# Patient Record
Sex: Female | Born: 1965 | Race: White | Hispanic: No | Marital: Married | Smoking: Never smoker
Health system: Southern US, Community
[De-identification: ages and names within clinical notes are randomized; demographics above are authoritative.]

## PROBLEM LIST (undated history)

## (undated) DIAGNOSIS — K760 Fatty (change of) liver, not elsewhere classified: Secondary | ICD-10-CM

## (undated) DIAGNOSIS — K219 Gastro-esophageal reflux disease without esophagitis: Secondary | ICD-10-CM

## (undated) DIAGNOSIS — Z8619 Personal history of other infectious and parasitic diseases: Secondary | ICD-10-CM

## (undated) DIAGNOSIS — K828 Other specified diseases of gallbladder: Secondary | ICD-10-CM

## (undated) DIAGNOSIS — N809 Endometriosis, unspecified: Secondary | ICD-10-CM

## (undated) DIAGNOSIS — J4520 Mild intermittent asthma, uncomplicated: Secondary | ICD-10-CM

## (undated) HISTORY — DX: Personal history of other infectious and parasitic diseases: Z86.19

## (undated) HISTORY — DX: Endometriosis, unspecified: N80.9

## (undated) HISTORY — DX: Other specified diseases of gallbladder: K82.8

## (undated) HISTORY — DX: Gastro-esophageal reflux disease without esophagitis: K21.9

## (undated) HISTORY — DX: Mild intermittent asthma, uncomplicated: J45.20

## (undated) HISTORY — DX: Fatty (change of) liver, not elsewhere classified: K76.0

---

## 2004-08-29 HISTORY — PX: ANKLE SURGERY: SHX546

## 2011-07-04 ENCOUNTER — Ambulatory Visit: Payer: Self-pay

## 2011-11-24 ENCOUNTER — Encounter: Payer: Self-pay | Admitting: Gynecology

## 2011-11-24 ENCOUNTER — Ambulatory Visit (INDEPENDENT_AMBULATORY_CARE_PROVIDER_SITE_OTHER): Payer: PRIVATE HEALTH INSURANCE | Admitting: Gynecology

## 2011-11-24 VITALS — Ht 64.5 in | Wt 160.0 lb

## 2011-11-24 DIAGNOSIS — R102 Pelvic and perineal pain: Secondary | ICD-10-CM

## 2011-11-24 DIAGNOSIS — N926 Irregular menstruation, unspecified: Secondary | ICD-10-CM

## 2011-11-24 DIAGNOSIS — K5900 Constipation, unspecified: Secondary | ICD-10-CM

## 2011-11-24 DIAGNOSIS — N949 Unspecified condition associated with female genital organs and menstrual cycle: Secondary | ICD-10-CM

## 2011-11-24 LAB — PROLACTIN: Prolactin: 6.4 ng/mL

## 2011-11-24 LAB — HCG, SERUM, QUALITATIVE: Preg, Serum: NEGATIVE

## 2011-11-24 NOTE — Patient Instructions (Signed)
Follow-up for blood work results and ultrasound as scheduled 

## 2011-11-24 NOTE — Progress Notes (Signed)
46 year old G0 new patient presents with a history of regular menses, on oral contraceptives for years until approximately a year ago when she started having pelvic pain and irregular bleeding. She had an evaluation in [place] to include ultrasound were apparently a generous ovarian cyst 6 cm historically was visualized with ground glass appearance and she was diagnosed with stage IV endometriosis based on the ultrasound. She had her oral contraceptives changed and was followed expectantly noting some improvement in her pain but her bleeding on and off persistent where she now has 2 bleeds per month. She now has had a recurrence of her pain with constipation. She was told before when she had this episode of pain and constipation that it was due to the ovarian cyst pushing on her colon.  She has no history of surgeries before and is not being followed for any significant medical issues. She reports having her last normal GYN checkup with Pap smear in August 2012.  Exam of Sherrilyn Rist chaperone present Abdomen soft with mild lower abdominal discomfort. No rebound masses guarding or organomegaly. Pelvic external BUS vagina normal. Cervix normal. Uterus upper limits of normal size firm midline mobile mild tenderness. Right and left adnexa with mild tenderness but without overt masses. Rectovaginal exam is normal.  Assessment and plan: Pelvic pain/irregular bleeding history of ultrasound showing ovarian cyst with changes suggestive of an endometrioma without history of surgery. On low-dose oral contraceptives.  I reviewed the risks of oral contraceptives to include stroke heart attack DVT. She does not smoke and is not being followed for any medical issues. Start with baseline hormone studies due to the irregular bleeding to include TSH FSH prolactin hCG. We'll start with sonohysterogram to better define pelvis and endometrial cavity due to her irregular bleeding. Various scenarios and the options were reviewed.  Childbearing is not an issue with them and they have been thinking about hysterectomy. Conservative laparoscopy with cystectomy or oophorectomy was reviewed. The issues of stage IV endometriosis and possible advantage of robotic laparoscopy also discussed. The Lupron or other hormonal suppression options reviewed. Patient will follow with office visit after sonohysterogram and hormone studies.

## 2011-12-05 ENCOUNTER — Other Ambulatory Visit: Payer: Self-pay | Admitting: Gynecology

## 2011-12-05 DIAGNOSIS — R102 Pelvic and perineal pain: Secondary | ICD-10-CM

## 2011-12-05 DIAGNOSIS — N926 Irregular menstruation, unspecified: Secondary | ICD-10-CM

## 2011-12-08 ENCOUNTER — Encounter: Payer: Self-pay | Admitting: Gynecology

## 2011-12-08 ENCOUNTER — Ambulatory Visit (INDEPENDENT_AMBULATORY_CARE_PROVIDER_SITE_OTHER): Payer: PRIVATE HEALTH INSURANCE | Admitting: Gynecology

## 2011-12-08 ENCOUNTER — Ambulatory Visit (INDEPENDENT_AMBULATORY_CARE_PROVIDER_SITE_OTHER): Payer: PRIVATE HEALTH INSURANCE

## 2011-12-08 DIAGNOSIS — D251 Intramural leiomyoma of uterus: Secondary | ICD-10-CM

## 2011-12-08 DIAGNOSIS — N920 Excessive and frequent menstruation with regular cycle: Secondary | ICD-10-CM

## 2011-12-08 DIAGNOSIS — R102 Pelvic and perineal pain: Secondary | ICD-10-CM

## 2011-12-08 DIAGNOSIS — D259 Leiomyoma of uterus, unspecified: Secondary | ICD-10-CM

## 2011-12-08 DIAGNOSIS — N926 Irregular menstruation, unspecified: Secondary | ICD-10-CM

## 2011-12-08 DIAGNOSIS — N8300 Follicular cyst of ovary, unspecified side: Secondary | ICD-10-CM

## 2011-12-08 DIAGNOSIS — N83209 Unspecified ovarian cyst, unspecified side: Secondary | ICD-10-CM

## 2011-12-08 DIAGNOSIS — N831 Corpus luteum cyst of ovary, unspecified side: Secondary | ICD-10-CM

## 2011-12-08 DIAGNOSIS — N949 Unspecified condition associated with female genital organs and menstrual cycle: Secondary | ICD-10-CM

## 2011-12-08 NOTE — Progress Notes (Signed)
Patient presents for sonohysterogram with history per 11/24/2011 note. Her hormone studies a TSH, prolactin, hCG, FSH were all normal.  Ultrasound shows a fundal myoma and 33 mm and a small 9 mm subserosal myoma. Right ovary shows a thin-walled echo-free 34 x 26 x 16 mm and a thicker walled collapsed cyst at 16 x 14. Left ovary shows a follicle at 26 x 29 mm. There is a moderate amount of fluid in the cul-de-sac with some septation. Endometrial echo 3.8 mm. Sonohysterogram was performed, sterile technique, easy catheter introduction, good distention with no abnormalities. Endometrial biopsy was taken. Patient tolerated well.  Assessment and plan: Pelvic pain irregular bleeding on oral contraceptives. Been told that she had stage IV endometriosis without laparoscopy. Prior groundglass 6 cm cyst no longer visible. A reviewed there is options with the patient and her husband. Assuming biopsy normal expected management with or without changes in her contraception. Depo-Lupron x3-6 months, conservative laparoscopy, laparoscopic hysterectomy with or without BSO. The pros/cons, risks/benefits of each choice reviewed. She understands that if she indeed does have stage IV endometriosis with socked in pelvis there is certainly a possibility to begin with laparoscopy aborted at that point and refer for robotic/TAH versus starting off with robotics in the first place.  The ovarian conservation issue with or without endometriosis was also reviewed. Her husband will think of their options and will further discuss pending the biopsy results.

## 2011-12-08 NOTE — Patient Instructions (Signed)
Follow up for biopsy results. Think of your options as we discussed.

## 2011-12-16 ENCOUNTER — Encounter: Payer: Self-pay | Admitting: Gynecology

## 2011-12-28 ENCOUNTER — Telehealth: Payer: Self-pay | Admitting: Gynecology

## 2011-12-28 ENCOUNTER — Ambulatory Visit (INDEPENDENT_AMBULATORY_CARE_PROVIDER_SITE_OTHER): Payer: PRIVATE HEALTH INSURANCE | Admitting: Gynecology

## 2011-12-28 ENCOUNTER — Encounter: Payer: Self-pay | Admitting: Gynecology

## 2011-12-28 DIAGNOSIS — R102 Pelvic and perineal pain: Secondary | ICD-10-CM

## 2011-12-28 DIAGNOSIS — K5900 Constipation, unspecified: Secondary | ICD-10-CM

## 2011-12-28 DIAGNOSIS — N926 Irregular menstruation, unspecified: Secondary | ICD-10-CM

## 2011-12-28 DIAGNOSIS — N949 Unspecified condition associated with female genital organs and menstrual cycle: Secondary | ICD-10-CM

## 2011-12-28 NOTE — Progress Notes (Signed)
Patient presents to discuss management options. She has a history of recurrent pelvic pain and irregular menses. Had been treated with oral contraceptives and was diagnosed with stage IV endometriosis based on an ultrasound showing a 6 cm ovarian cyst. Patient has every 6 months or so a bout of severe constipation and pelvic pain that lasts a week which was attributed to this cyst/endometriosis. Recent ultrasound here showed bilateral ovarian cysts clear measuring 34 mm and 29 mm. She also a small fundal myoma measuring 33 mm.  Sonohysterogram showed a normal cavity with a biopsy showing secratory endometrium.  I reviewed options with the patient and her husband. Options for continued oral contraceptive suppression, progesterone only, trial of Depo-Lupron for 6 months, diagnostic laparoscopy, ovarian cystectomy, laparoscopic hysterectomy with or without salpingo-oophorectomy. The pros/cons, risk/benefits of each choice reviewed. My preference would be diagnostic laparoscopy which would allow for definitive diagnosis of endometriosis or not as she does not have a visual confirmation for this diagnosis. I do not think this is related to her bouts of constipation and pain as it is not monthly but occurs about every 6 months or so. I would recommend seeing a gastroenterologist in reference to this for further evaluation. Patient and her husband want to think about options. They asked if we could look into insurance issues is for surgery and will pursue that and they will follow up with me with their decision.

## 2011-12-28 NOTE — Telephone Encounter (Signed)
Left message home and cell phone to call me as I have checked her insurance benefits and ready to discuss with her.

## 2011-12-28 NOTE — Patient Instructions (Signed)
Follow up with decision as far as surgery is concerned.

## 2012-08-06 ENCOUNTER — Other Ambulatory Visit: Payer: Self-pay | Admitting: Gynecology

## 2012-08-07 NOTE — Telephone Encounter (Signed)
Spoke to pt she is due for AEX. She knows she will schedule. She has to call back I gave a refill to give her time to schedule

## 2012-09-04 ENCOUNTER — Other Ambulatory Visit: Payer: Self-pay | Admitting: Gynecology

## 2012-11-23 ENCOUNTER — Other Ambulatory Visit: Payer: Self-pay | Admitting: Gynecology

## 2012-11-26 ENCOUNTER — Encounter: Payer: Self-pay | Admitting: Gynecology

## 2012-11-26 ENCOUNTER — Ambulatory Visit (INDEPENDENT_AMBULATORY_CARE_PROVIDER_SITE_OTHER): Payer: Self-pay | Admitting: Gynecology

## 2012-11-26 VITALS — BP 130/80 | Ht 65.0 in | Wt 170.0 lb

## 2012-11-26 DIAGNOSIS — Z01419 Encounter for gynecological examination (general) (routine) without abnormal findings: Secondary | ICD-10-CM

## 2012-11-26 DIAGNOSIS — N809 Endometriosis, unspecified: Secondary | ICD-10-CM

## 2012-11-26 MED ORDER — NORETHIN ACE-ETH ESTRAD-FE 1-20 MG-MCG PO TABS: ORAL_TABLET | ORAL | Status: DC

## 2012-11-26 NOTE — Progress Notes (Signed)
Patricia Owen 11/04/1965 [id number]        47 y.o.  G0P0 for annual exam.  Several issues noted below.  Past medical history,surgical history, medications, allergies, family history and social history were all reviewed and documented in the EPIC chart. ROS:  Was performed and pertinent positives and negatives are included in the history.  Exam: Kim assistant Filed Vitals:   11/26/12 0910  BP: 130/80  Height: 5\' 5"  (1.651 m)  Weight: 170 lb (77.111 kg)   General appearance  Normal Skin grossly normal Head/Neck normal with no cervical or supraclavicular adenopathy thyroid normal Lungs  clear Cardiac RR, without RMG Abdominal  soft, nontender, without masses, organomegaly or hernia Breasts  examined lying and sitting without masses, retractions, discharge or axillary adenopathy. Pelvic  Ext/BUS/vagina  normal   Cervix  normal   Uterus  anteverted, normal size, shape and contour, midline and mobile nontender   Adnexa  Without masses or tenderness    Anus and perineum  normal   Rectovaginal  normal sphincter tone without palpated masses or tenderness.    Assessment/Plan:  47 y.o. G0P0 female for annual exam, regular menses, oral contraceptives..   1. History endometriosis/pelvic pain. Since restarting oral contraceptives in May she has had regular menses. She's also had relief of her pelvic pain which now is pretty much gone. She does note she's modified her diet to a decreased gluten and notes some improvement with this. I reviewed with her on how much of her discomfort was actually GYN versus GI. Currently she's doing well and wants to continue on the pills. I rediscussed the risks to include stroke heart attack DVT. Possible increased risk with age. She not being followed for any medical issues and does not smoke and accepts the risks. I refilled her prescription x1 year. 2. Mammography 2 years ago. Recommended mammogram now and she agrees with scheduling. SBE monthly reviewed. 3. Pap  smear 2012. None done today. No history of abnormal Pap smears previously. Plan repeat next year at 3 year interval. 4. Health maintenance. No lab work done today at her request. Reports having normal lipid profile and glucose 1-1/2 half year ago at her other gynecologist office and prefers not to repeat now she is paying cash. Will check urinalysis.  I did ask her to keep a track of her blood pressure which is 130/80. Have it rechecked in a non-exam situation as long as it runs normal then we'll follow if it does seem to creep up at all then she knows to report that to me. Otherwise followup 1 year, sooner as needed.    Dara Lords MD, 11:16 AM 11/26/2012

## 2012-11-26 NOTE — Patient Instructions (Signed)
Call to Schedule your mammogram  Facilities in [place]: 1)  [facility], [address] Phone: [phone] 2)  [facility], [address] Phone: [phone] 3)  Dr. Bertrand at [facility]  [address] Phone: [phone]     Mammogram A mammogram is an X-ray test to find changes in a woman's breast. You should get a mammogram if:  You are 40 years of age or older  You have risk factors.   Your doctor recommends that you have one.  BEFORE THE TEST  Do not schedule the test the week before your period, especially if your breasts are sore during this time.  On the day of your mammogram:  Wash your breasts and armpits well. After washing, do not put on any deodorant or talcum powder on until after your test.   Eat and drink as you usually do.   Take your medicines as usual.   If you are diabetic and take insulin, make sure you:   Eat before coming for your test.   Take your insulin as usual.   If you cannot keep your appointment, call before the appointment to cancel. Schedule another appointment.  TEST  You will need to undress from the waist up. You will put on a hospital gown.   Your breast will be put on the mammogram machine, and it will press firmly on your breast with a piece of plastic called a compression paddle. This will make your breast flatter so that the machine can X-ray all parts of your breast.   Both breasts will be X-rayed. Each breast will be X-rayed from above and from the side. An X-ray might need to be taken again if the picture is not good enough.   The mammogram will last about 15 to 30 minutes.  AFTER THE TEST Finding out the results of your test Ask when your test results will be ready. Make sure you get your test results.  Document Released: 11/11/2008 Document Revised: 08/04/2011 Document Reviewed: 11/11/2008 [employer] Patient  Information 2012 [employer].   

## 2012-11-27 LAB — URINALYSIS W MICROSCOPIC + REFLEX CULTURE
Bacteria, UA: NONE SEEN
Casts: NONE SEEN
Crystals: NONE SEEN
Ketones, ur: NEGATIVE mg/dL
Nitrite: NEGATIVE
Specific Gravity, Urine: 1.016 (ref 1.005–1.030)
Squamous Epithelial / LPF: NONE SEEN
Urobilinogen, UA: 0.2 mg/dL (ref 0.0–1.0)
pH: 6 (ref 5.0–8.0)

## 2013-11-18 ENCOUNTER — Ambulatory Visit (INDEPENDENT_AMBULATORY_CARE_PROVIDER_SITE_OTHER): Payer: BC Managed Care – PPO | Admitting: Pulmonary Disease

## 2013-11-18 ENCOUNTER — Encounter: Payer: Self-pay | Admitting: Pulmonary Disease

## 2013-11-18 ENCOUNTER — Encounter (INDEPENDENT_AMBULATORY_CARE_PROVIDER_SITE_OTHER): Payer: Self-pay

## 2013-11-18 VITALS — BP 118/78 | HR 83 | Temp 98.0°F | Ht 65.0 in | Wt 183.8 lb

## 2013-11-18 DIAGNOSIS — R1110 Vomiting, unspecified: Secondary | ICD-10-CM

## 2013-11-18 DIAGNOSIS — R058 Other specified cough: Secondary | ICD-10-CM

## 2013-11-18 DIAGNOSIS — R05 Cough: Secondary | ICD-10-CM

## 2013-11-18 DIAGNOSIS — R053 Chronic cough: Secondary | ICD-10-CM | POA: Insufficient documentation

## 2013-11-18 DIAGNOSIS — R059 Cough, unspecified: Secondary | ICD-10-CM

## 2013-11-18 DIAGNOSIS — J342 Deviated nasal septum: Secondary | ICD-10-CM

## 2013-11-18 MED ORDER — FLUTICASONE PROPIONATE 50 MCG/ACT NA SUSP: 2.0000 | Freq: Every day | NASAL | Status: DC

## 2013-11-18 MED ORDER — ALBUTEROL SULFATE HFA 108 (90 BASE) MCG/ACT IN AERS: 2.0000 | INHALATION_SPRAY | Freq: Four times a day (QID) | RESPIRATORY_TRACT | Status: DC | PRN

## 2013-11-18 NOTE — Progress Notes (Signed)
Chief Complaint  Patient presents with  . Pulmonary Consult    Self referral. C/o recurrent cough and congestion x 1 year. C/o mid back pain and SOB with cough, chest tightness when taking deep breaths.      History of Present Illness: Patricia Owen is a 48 y.o. female for evaluation of cough.  She has noticed a cough for the past two years.  She had strep throat then, and then developed a persistent cough.  She has been treated several times with antibiotics for bronchitis, but these have not helped.    She feels chest pressure and tightness that radiates around to her back when she has a cough.  She gets episodes of wheezing associated with chest congestion.  She has sinus congestion with green/yellow sputum.  She was tried on nasal spray (she is not sure which one), but this did not help.  She was tried on prednisone in January 2014 >> she could only take this for 2 days >> she felt like her skin was crawling.  She does not recall ever being tried on inhalers for her breathing.  She also reports feeling like something is stuck in her throat, and she will try to force a cough.  She will get into coughing spells, and this causes her to vomit sometimes.  She gets seasonal allergies, and this is worse in Spring and Fall.  She thinks she has allergies to pollen.  She has a pet dog, and no difficulty around her dog.  She has never had allergy testing.  She does not recall having a chest xray or breathing tests.  She notices more trouble in hot/humid or cold/dry weather.  She also notices more cough when laying flat.  She denies skin rash.  She has problems with heartburn after she vomits, but not otherwise.    She works as a Geophysicist/field seismologist.  She is from [place].  She has never had pneumonia.  Her mother had latent TB, but Mrs. Cerritos does not recall ever being told she had tuberculosis.   Zorana Brockwell  has a past medical history of Endometriosis.  Krystiana Fornes  has past surgical history that includes  Broken Ankle repair.  Prior to Admission medications   Medication Sig Start Date End Date Taking? Authorizing Provider  cholecalciferol (VITAMIN D) 1000 UNITS tablet Take 2,000 Units by mouth daily.   Yes Historical Provider, MD  norethindrone-ethinyl estradiol (JUNEL FE 1/20) 1-20 MG-MCG tablet TAKE 1 TABLET BY MOUTH ONCE A DAY 11/26/12  Yes Anastasio Auerbach, MD    Allergies  Allergen Reactions  . Benadryl [Diphenhydramine Hcl]   . Codeine   . Prednisone     Her family history includes Diabetes in her father; Heart disease in her father; Hypertension in her father.  She  reports that she has never smoked. She has never used smokeless tobacco. She reports that she drinks alcohol. She reports that she does not use illicit drugs.  Review of Systems  Constitutional: Positive for fever and unexpected weight change.  HENT: Positive for congestion, ear pain, nosebleeds, postnasal drip, rhinorrhea, sinus pressure and sore throat. Negative for dental problem, sneezing and trouble swallowing.   Eyes: Negative for redness and itching.  Respiratory: Positive for cough and chest tightness. Negative for shortness of breath and wheezing.   Cardiovascular: Negative for palpitations and leg swelling.  Gastrointestinal: Positive for nausea and vomiting.  Genitourinary: Negative for dysuria.  Musculoskeletal: Positive for joint swelling.  Skin: Negative for rash.  Neurological: Negative for headaches.  Hematological: Does not bruise/bleed easily.  Psychiatric/Behavioral: Negative for dysphoric mood. The patient is not nervous/anxious.    Physical Exam:  General - No distress ENT - No sinus tenderness, no oral exudate, no LAN, no thyromegaly, TM clear, pupils equal/reactive, nasal septal deviation Cardiac - s1s2 regular, no murmur, pulses symmetric Chest - No wheeze/rales/dullness, good air entry, normal respiratory excursion Back - No focal tenderness Abd - Soft, non-tender, no organomegaly, +  bowel sounds Ext - No edema Neuro - Normal strength, cranial nerves intact Skin - No rashes Psych - Normal mood, and behavior    Assessment/Plan:  Chesley Mires, MD [facility] Pager:  [phone]

## 2013-11-18 NOTE — Patient Instructions (Signed)
Will schedule chest xray and breathing test (PFT) Nasal irrigation (saline nasal spray daily) Flonase two sprays each nostril daily Salt water gargles once or twice per day Sip water when you have urge to cough Use sugarless candy to keep mouth moist Proair two puffs up to four times per day as needed for cough, wheeze, or chest congestion Follow up in 4 weeks

## 2013-11-18 NOTE — Assessment & Plan Note (Addendum)
She seems to have a component of allergies with post-nasal drip causing upper airway cough syndrome .  She likely has developed asthma after her respiratory infection two years ago.  These have contributed to cough induced emesis.  Will schedule chest xray and breathing test (PFT).  Advised her to use nasal irrigation daily, flonase two sprays each nostril daily.  She is to do salt water gargles once or twice per day, and sip water when you have urge to cough.  She should use sugarless candy to keep mouth moist.  Will also have her use proair two puffs up to four times per day as needed for cough, wheeze, or chest congestion for now.  She may need CT sinus and ENT evaluation for her nasal septal deviation if her sinus symptoms persist.  She may also need additional allergy evaluation at some point.

## 2013-11-18 NOTE — Progress Notes (Deleted)
   Subjective:    Patient ID: Aranda Bihm, female    DOB: 04/02/1966, 48 y.o.   MRN: [id number]  HPI    Review of Systems  Constitutional: Positive for fever and unexpected weight change.  HENT: Positive for congestion, ear pain, nosebleeds, postnasal drip, rhinorrhea, sinus pressure and sore throat. Negative for dental problem, sneezing and trouble swallowing.   Eyes: Negative for redness and itching.  Respiratory: Positive for cough and chest tightness. Negative for shortness of breath and wheezing.   Cardiovascular: Negative for palpitations and leg swelling.  Gastrointestinal: Positive for nausea and vomiting.  Genitourinary: Negative for dysuria.  Musculoskeletal: Positive for joint swelling.  Skin: Negative for rash.  Neurological: Negative for headaches.  Hematological: Does not bruise/bleed easily.  Psychiatric/Behavioral: Negative for dysphoric mood. The patient is not nervous/anxious.        Objective:   Physical Exam        Assessment & Plan:

## 2013-11-22 ENCOUNTER — Other Ambulatory Visit: Payer: Self-pay | Admitting: Gynecology

## 2013-11-25 ENCOUNTER — Telehealth: Payer: Self-pay | Admitting: Emergency Medicine

## 2013-11-25 NOTE — Telephone Encounter (Signed)
Called pt and left message on mobile phone for pt to call back. Pt will need to come in just to get a cxr. Pt was in office last week when xray was down. ATC home phone but was unable to leave message.

## 2013-11-26 NOTE — Telephone Encounter (Signed)
Called and spoke to pt. Pt stated she would be able to come in tomorrow morning, 11/27/2013, to get the CXR. Pt is aware of where to go and denied further questions or concerns at this time.

## 2013-11-27 ENCOUNTER — Ambulatory Visit (INDEPENDENT_AMBULATORY_CARE_PROVIDER_SITE_OTHER)
Admission: RE | Admit: 2013-11-27 | Discharge: 2013-11-27 | Disposition: A | Discharge status: Home / Self Care | Payer: BC Managed Care – PPO | Source: Ambulatory Visit | Attending: Pulmonary Disease | Admitting: Pulmonary Disease

## 2013-11-27 DIAGNOSIS — R059 Cough, unspecified: Secondary | ICD-10-CM

## 2013-11-27 DIAGNOSIS — R053 Chronic cough: Secondary | ICD-10-CM

## 2013-11-27 DIAGNOSIS — R05 Cough: Secondary | ICD-10-CM

## 2013-11-28 ENCOUNTER — Telehealth: Payer: Self-pay | Admitting: Pulmonary Disease

## 2013-11-28 NOTE — Telephone Encounter (Signed)
Patient calling for results.

## 2013-11-28 NOTE — Telephone Encounter (Signed)
Pt aware of results 

## 2013-11-28 NOTE — Telephone Encounter (Signed)
Dg Chest 2 View  11/27/2013   CLINICAL DATA:  Chronic cough  EXAM: CHEST  2 VIEW  COMPARISON:  None.  FINDINGS: Lungs are clear. Heart size and pulmonary vascularity are normal. No adenopathy. No bone lesions.  IMPRESSION: No edema or consolidation.   Electronically Signed   By: Lowella Grip M.D.   On: 11/27/2013 09:09    Will have my nurse inform pt that CXR is normal.

## 2013-12-09 ENCOUNTER — Ambulatory Visit (INDEPENDENT_AMBULATORY_CARE_PROVIDER_SITE_OTHER): Payer: BC Managed Care – PPO | Admitting: Gynecology

## 2013-12-09 ENCOUNTER — Encounter: Payer: Self-pay | Admitting: Gynecology

## 2013-12-09 ENCOUNTER — Other Ambulatory Visit (HOSPITAL_COMMUNITY)
Admission: RE | Admit: 2013-12-09 | Discharge: 2013-12-09 | Disposition: A | Discharge status: Home / Self Care | Payer: BC Managed Care – PPO | Source: Ambulatory Visit | Attending: Gynecology | Admitting: Gynecology

## 2013-12-09 VITALS — BP 120/70 | Ht 64.5 in | Wt 182.2 lb

## 2013-12-09 DIAGNOSIS — Z01419 Encounter for gynecological examination (general) (routine) without abnormal findings: Secondary | ICD-10-CM | POA: Insufficient documentation

## 2013-12-09 DIAGNOSIS — N926 Irregular menstruation, unspecified: Secondary | ICD-10-CM

## 2013-12-09 DIAGNOSIS — Z1151 Encounter for screening for human papillomavirus (HPV): Secondary | ICD-10-CM | POA: Insufficient documentation

## 2013-12-09 LAB — CBC WITH DIFFERENTIAL/PLATELET
Basophils Absolute: 0.1 10*3/uL (ref 0.0–0.1)
Basophils Relative: 1 % (ref 0–1)
Eosinophils Absolute: 0.1 10*3/uL (ref 0.0–0.7)
Eosinophils Relative: 1 % (ref 0–5)
HCT: 38.7 % (ref 36.0–46.0)
Hemoglobin: 12.8 g/dL (ref 12.0–15.0)
LYMPHS PCT: 28 % (ref 12–46)
Lymphs Abs: 1.7 10*3/uL (ref 0.7–4.0)
MCH: 29 pg (ref 26.0–34.0)
MCHC: 33.1 g/dL (ref 30.0–36.0)
MCV: 87.8 fL (ref 78.0–100.0)
Monocytes Absolute: 0.5 10*3/uL (ref 0.1–1.0)
Monocytes Relative: 8 % (ref 3–12)
NEUTROS ABS: 3.8 10*3/uL (ref 1.7–7.7)
NEUTROS PCT: 62 % (ref 43–77)
PLATELETS: 267 10*3/uL (ref 150–400)
RBC: 4.41 MIL/uL (ref 3.87–5.11)
RDW: 13.5 % (ref 11.5–15.5)
WBC: 6.1 10*3/uL (ref 4.0–10.5)

## 2013-12-09 LAB — COMPREHENSIVE METABOLIC PANEL
ALT: 10 U/L (ref 0–35)
AST: 12 U/L (ref 0–37)
Albumin: 4 g/dL (ref 3.5–5.2)
Alkaline Phosphatase: 48 U/L (ref 39–117)
BUN: 11 mg/dL (ref 6–23)
CHLORIDE: 104 meq/L (ref 96–112)
CO2: 25 meq/L (ref 19–32)
Calcium: 9.1 mg/dL (ref 8.4–10.5)
Creat: 0.7 mg/dL (ref 0.50–1.10)
Glucose, Bld: 64 mg/dL — ABNORMAL LOW (ref 70–99)
Potassium: 4.5 mEq/L (ref 3.5–5.3)
Sodium: 139 mEq/L (ref 135–145)
TOTAL PROTEIN: 6.9 g/dL (ref 6.0–8.3)
Total Bilirubin: 0.3 mg/dL (ref 0.2–1.2)

## 2013-12-09 LAB — URINALYSIS W MICROSCOPIC + REFLEX CULTURE
BILIRUBIN URINE: NEGATIVE
Bacteria, UA: NONE SEEN
CRYSTALS: NONE SEEN
Casts: NONE SEEN
Glucose, UA: NEGATIVE mg/dL
Hgb urine dipstick: NEGATIVE
Ketones, ur: NEGATIVE mg/dL
LEUKOCYTES UA: NEGATIVE
Nitrite: NEGATIVE
Protein, ur: NEGATIVE mg/dL
SPECIFIC GRAVITY, URINE: 1.006 (ref 1.005–1.030)
SQUAMOUS EPITHELIAL / LPF: NONE SEEN
UROBILINOGEN UA: 0.2 mg/dL (ref 0.0–1.0)
pH: 6 (ref 5.0–8.0)

## 2013-12-09 LAB — TSH: TSH: 1.456 u[IU]/mL (ref 0.350–4.500)

## 2013-12-09 LAB — LIPID PANEL
CHOLESTEROL: 123 mg/dL (ref 0–200)
HDL: 63 mg/dL (ref >39)
LDL Cholesterol: 42 mg/dL (ref 0–99)
Total CHOL/HDL Ratio: 2 Ratio
Triglycerides: 92 mg/dL (ref <150)
VLDL: 18 mg/dL (ref 0–40)

## 2013-12-09 MED ORDER — NORETHIN ACE-ETH ESTRAD-FE 1-20 MG-MCG PO TABS: ORAL_TABLET | ORAL | Status: DC

## 2013-12-09 NOTE — Patient Instructions (Addendum)
Call to Schedule your mammogram  Facilities in [place]: 1)  [facility], [address] Phone: [phone] 2)  [facility], [address] Phone: [phone] 3)  Dr[facility] Bertrand at [facility]  [address] Phone: [phone]     Mammogram A mammogram is an X-ray test to find changes in a woman's breast. You should get a mammogram if:  You are 40 years of age or older  You have risk factors.   Your doctor recommends that you have one.  BEFORE THE TEST  Do not schedule the test the week before your period, especially if your breasts are sore during this time.  On the day of your mammogram:  Wash your breasts and armpits well. After washing, do not put on any deodorant or talcum powder on until after your test.   Eat and drink as you usually do.   Take your medicines as usual.   If you are diabetic and take insulin, make sure you:   Eat before coming for your test.   Take your insulin as usual.   If you cannot keep your appointment, call before the appointment to cancel. Schedule another appointment.  TEST  You will need to undress from the waist up. You will put on a hospital gown.   Your breast will be put on the mammogram machine, and it will press firmly on your breast with a piece of plastic called a compression paddle. This will make your breast flatter so that the machine can X-ray all parts of your breast.   Both breasts will be X-rayed. Each breast will be X-rayed from above and from the side. An X-ray might need to be taken again if the picture is not good enough.   The mammogram will last about 15 to 30 minutes.  AFTER THE TEST Finding out the results of your test Ask when your test results will be ready. Make sure you get your test results.  Document Released: 11/11/2008 Document Revised: 08/04/2011 Document Reviewed: 11/11/2008 [employer] Patient  Information 2012 [employer].   You may obtain a copy of any labs that were done today by logging onto MyChart as outlined in the instructions provided with your AVS (after visit summary). The office will not call with normal lab results but certainly if there are any significant abnormalities then we will contact you.   Health Maintenance, Female A healthy lifestyle and preventative care can promote health and wellness.  Maintain regular health, dental, and eye exams.  Eat a healthy diet. Foods like vegetables, fruits, whole grains, low-fat dairy products, and lean protein foods contain the nutrients you need without too many calories. Decrease your intake of foods high in solid fats, added sugars, and salt. Get information about a proper diet from your caregiver, if necessary.  Regular physical exercise is one of the most important things you can do for your health. Most adults should get at least 150 minutes of moderate-intensity exercise (any activity that increases your heart rate and causes you to sweat) each week. In addition, most adults need muscle-strengthening exercises on 2 or more days a week.   Maintain a healthy weight. The body mass index (BMI) is a screening tool to identify possible weight problems. It provides an estimate of body fat based on height and weight. Your caregiver can help determine your BMI, and can help you achieve or maintain a healthy weight. For   adults 20 years and older:  A BMI below 18.5 is considered underweight.  A BMI of 18.5 to 24.9 is normal.  A BMI of 25 to 29.9 is considered overweight.  A BMI of 30 and above is considered obese.  Maintain normal blood lipids and cholesterol by exercising and minimizing your intake of saturated fat. Eat a balanced diet with plenty of fruits and vegetables. Blood tests for lipids and cholesterol should begin at age 20 and be repeated every 5 years. If your lipid or cholesterol levels are high, you are over 50,  or you are a high risk for heart disease, you may need your cholesterol levels checked more frequently.Ongoing high lipid and cholesterol levels should be treated with medicines if diet and exercise are not effective.  If you smoke, find out from your caregiver how to quit. If you do not use tobacco, do not start.  Lung cancer screening is recommended for adults aged 55 80 years who are at high risk for developing lung cancer because of a history of smoking. Yearly low-dose computed tomography (CT) is recommended for people who have at least a 30-pack-year history of smoking and are a current smoker or have quit within the past 15 years. A pack year of smoking is smoking an average of 1 pack of cigarettes a day for 1 year (for example: 1 pack a day for 30 years or 2 packs a day for 15 years). Yearly screening should continue until the smoker has stopped smoking for at least 15 years. Yearly screening should also be stopped for people who develop a health problem that would prevent them from having lung cancer treatment.  If you are pregnant, do not drink alcohol. If you are breastfeeding, be very cautious about drinking alcohol. If you are not pregnant and choose to drink alcohol, do not exceed 1 drink per day. One drink is considered to be 12 ounces (355 mL) of beer, 5 ounces (148 mL) of wine, or 1.5 ounces (44 mL) of liquor.  Avoid use of street drugs. Do not share needles with anyone. Ask for help if you need support or instructions about stopping the use of drugs.  High blood pressure causes heart disease and increases the risk of stroke. Blood pressure should be checked at least every 1 to 2 years. Ongoing high blood pressure should be treated with medicines, if weight loss and exercise are not effective.  If you are 55 to 47 years old, ask your caregiver if you should take aspirin to prevent strokes.  Diabetes screening involves taking a blood sample to check your fasting blood sugar level. This  should be done once every 3 years, after age 45, if you are within normal weight and without risk factors for diabetes. Testing should be considered at a younger age or be carried out more frequently if you are overweight and have at least 1 risk factor for diabetes.  Breast cancer screening is essential preventative care for women. You should practice "breast self-awareness." This means understanding the normal appearance and feel of your breasts and may include breast self-examination. Any changes detected, no matter how small, should be reported to a caregiver. Women in their 20s and 30s should have a clinical breast exam (CBE) by a caregiver as part of a regular health exam every 1 to 3 years. After age 40, women should have a CBE every year. Starting at age 40, women should consider having a mammogram (breast X-ray) every year. Women who have   a family history of breast cancer should talk to their caregiver about genetic screening. Women at a high risk of breast cancer should talk to their caregiver about having an MRI and a mammogram every year.  Breast cancer gene (BRCA)-related cancer risk assessment is recommended for women who have family members with BRCA-related cancers. BRCA-related cancers include breast, ovarian, tubal, and peritoneal cancers. Having family members with these cancers may be associated with an increased risk for harmful changes (mutations) in the breast cancer genes BRCA1 and BRCA2. Results of the assessment will determine the need for genetic counseling and BRCA1 and BRCA2 testing.  The Pap test is a screening test for cervical cancer. Women should have a Pap test starting at age 21. Between ages 21 and 29, Pap tests should be repeated every 2 years. Beginning at age 30, you should have a Pap test every 3 years as long as the past 3 Pap tests have been normal. If you had a hysterectomy for a problem that was not cancer or a condition that could lead to cancer, then you no longer  need Pap tests. If you are between ages 65 and 70, and you have had normal Pap tests going back 10 years, you no longer need Pap tests. If you have had past treatment for cervical cancer or a condition that could lead to cancer, you need Pap tests and screening for cancer for at least 20 years after your treatment. If Pap tests have been discontinued, risk factors (such as a new sexual partner) need to be reassessed to determine if screening should be resumed. Some women have medical problems that increase the chance of getting cervical cancer. In these cases, your caregiver may recommend more frequent screening and Pap tests.  The human papillomavirus (HPV) test is an additional test that may be used for cervical cancer screening. The HPV test looks for the virus that can cause the cell changes on the cervix. The cells collected during the Pap test can be tested for HPV. The HPV test could be used to screen women aged 30 years and older, and should be used in women of any age who have unclear Pap test results. After the age of 30, women should have HPV testing at the same frequency as a Pap test.  Colorectal cancer can be detected and often prevented. Most routine colorectal cancer screening begins at the age of 50 and continues through age 75. However, your caregiver may recommend screening at an earlier age if you have risk factors for colon cancer. On a yearly basis, your caregiver may provide home test kits to check for hidden blood in the stool. Use of a small camera at the end of a tube, to directly examine the colon (sigmoidoscopy or colonoscopy), can detect the earliest forms of colorectal cancer. Talk to your caregiver about this at age 50, when routine screening begins. Direct examination of the colon should be repeated every 5 to 10 years through age 75, unless early forms of pre-cancerous polyps or small growths are found.  Hepatitis C blood testing is recommended for all people born from 1945  through 1965 and any individual with known risks for hepatitis C.  Practice safe sex. Use condoms and avoid high-risk sexual practices to reduce the spread of sexually transmitted infections (STIs). Sexually active women aged 25 and younger should be checked for Chlamydia, which is a common sexually transmitted infection. Older women with new or multiple partners should also be tested for Chlamydia. Testing   for other STIs is recommended if you are sexually active and at increased risk.  Osteoporosis is a disease in which the bones lose minerals and strength with aging. This can result in serious bone fractures. The risk of osteoporosis can be identified using a bone density scan. Women ages 19 and over and women at risk for fractures or osteoporosis should discuss screening with their caregivers. Ask your caregiver whether you should be taking a calcium supplement or vitamin D to reduce the rate of osteoporosis.  Menopause can be associated with physical symptoms and risks. Hormone replacement therapy is available to decrease symptoms and risks. You should talk to your caregiver about whether hormone replacement therapy is right for you.  Use sunscreen. Apply sunscreen liberally and repeatedly throughout the day. You should seek shade when your shadow is shorter than you. Protect yourself by wearing long sleeves, pants, a wide-brimmed hat, and sunglasses year round, whenever you are outdoors.  Notify your caregiver of new moles or changes in moles, especially if there is a change in shape or color. Also notify your caregiver if a mole is larger than the size of a pencil eraser.  Stay current with your immunizations. Document Released: 02/28/2011 Document Revised: 12/10/2012 Document Reviewed: 02/28/2011 [facility] Patient Information 2014 [place].

## 2013-12-09 NOTE — Addendum Note (Signed)
Addended by: Alen Blew on: 12/09/2013 11:28 AM   Modules accepted: Orders

## 2013-12-09 NOTE — Progress Notes (Signed)
Patricia Owen 06-27-1966 [id number]        48 y.o.  G0P0 for annual exam.  Several issues noted below.  Past medical history,surgical history, problem list, medications, allergies, family history and social history were all reviewed and documented as reviewed in the EPIC chart.  ROS:  12 system ROS performed with pertinent positives and negatives included in the history, assessment and plan.  Additional significant findings : Irregular menses, cough, vomiting, occasional constipation, weight gain  Included Systems: General, HEENT, Neck, Cardiovascular, Pulmonary, Gastrointestinal, Genitourinary, Musculoskeletal, Dermatologic, Endocrine, Hematological, Neurologic, Psychiatric  Exam: Sharrie Rothman assistant Filed Vitals:   12/09/13 0925  BP: 120/70  Height: 5' 4.5" (1.638 m)  Weight: 182 lb 3.2 oz (82.645 kg)   General appearance:  Normal affect, orientation and appearance. Skin: Grossly normal HEENT: Normal without gross oral lesions, cervical or supraclavicular adenopathy. Thyroid normal.  Ears/nose appear normal Lungs:  Clear without wheezing, rales or rhonchi Cardiac: RR, without RMG Abdominal:  Soft, nontender, without masses, guarding, rebound, organomegaly or hernia Breasts:  Examined lying and sitting without masses, retractions, discharge or axillary adenopathy. Pelvic:  Ext/BUS/vagina normal  Cervix normal. Pap/HPV done  Uterus anteverted, normal size, shape and contour, midline and mobile nontender   Adnexa  Without masses or tenderness    Anus and perineum  Normal   Rectovaginal  Normal sphincter tone without palpated masses or tenderness.    Assessment/Plan:  48 y.o. G0P0 female for annual exam irregular menses, oral contraceptives.   1. Irregular menses. Patient is on low-dose oral contraceptives which helps with her pelvic pain due to her endometriosis. Does note occasional skips when on her pill free week. Most recently had bleeding for 3 weeks in January around the time when  she was on a lot of medication for cough/wheezing and she is under the care of her physician actively being monitored for this. No other history of prolonged or atypical bleeding. I reviewed with her and her husband not unusual to skip menses on a low-dose pill as long as she is consistent in taking the pill and has no concern about pregnancy then that's okay and she is comfortable with this. The prolonged bleeding was a single episode. I discussed since it was one episode we'll monitor at this time. If she would have recurrences of prolonged or atypical bleeding that she knows to call me and we'll pursue a further evaluation. I will go ahead and check a TSH FSH today. I again reviewed the risks particularly with advancing age of blood clots to include stroke heart attack DVT. She never smoked and is not being followed for medical issues such as hypertension diabetes understands and accepts the risks and I refilled her pills x1 year. 2. Cough/wheezing. Lungs are clear today. She is actively being followed by her primary reference to this and will continue to do so. GI she has an exam for PFTs coming up. She notes vomiting sometimes when she has a significant coughing spell. Not on a regular basis. 3. Weight gain 20-30 pounds reported this past winter. Has not exercised at all due to the coughing and respiratory issues. No other symptoms such as hair changes or rashes. Occasional constipation but no consistent diarrhea or constipation. Reviewed calorie skin/calories out and the need to increase exercise. We'll follow up on a TSH today also. 4. Mammography overdue. Last mammogram reported 2012. Strongly recommended scheduling and she agrees to do so. Names and numbers provided. SBE monthly reviewed. 5. Pap smear 2012. Pap/HPV today. No  history of abnormal Pap smears previously. 6. Health maintenance. Baseline CBC comprehensive metabolic panel lipid profile urinalysis ordered along with her TSH FSH. Followup in  one year, sooner as outlined above.   Note: This document was prepared with digital dictation and possible smart phrase technology. Any transcriptional errors that result from this process are unintentional.   Anastasio Auerbach MD, 9:56 AM 12/09/2013

## 2013-12-10 ENCOUNTER — Other Ambulatory Visit: Payer: Self-pay | Admitting: Gynecology

## 2013-12-10 LAB — FOLLICLE STIMULATING HORMONE: FSH: 27.1 m[IU]/mL

## 2013-12-10 MED ORDER — FLUCONAZOLE 150 MG PO TABS: 150.0000 mg | ORAL_TABLET | Freq: Once | ORAL | Status: DC

## 2013-12-23 ENCOUNTER — Encounter: Payer: Self-pay | Admitting: Pulmonary Disease

## 2013-12-23 ENCOUNTER — Ambulatory Visit (INDEPENDENT_AMBULATORY_CARE_PROVIDER_SITE_OTHER): Payer: BC Managed Care – PPO | Admitting: Pulmonary Disease

## 2013-12-23 VITALS — BP 118/82 | HR 82 | Ht 64.0 in | Wt 183.0 lb

## 2013-12-23 DIAGNOSIS — R059 Cough, unspecified: Secondary | ICD-10-CM

## 2013-12-23 DIAGNOSIS — R053 Chronic cough: Secondary | ICD-10-CM

## 2013-12-23 DIAGNOSIS — R05 Cough: Secondary | ICD-10-CM

## 2013-12-23 DIAGNOSIS — J45909 Unspecified asthma, uncomplicated: Secondary | ICD-10-CM

## 2013-12-23 DIAGNOSIS — J342 Deviated nasal septum: Secondary | ICD-10-CM | POA: Insufficient documentation

## 2013-12-23 DIAGNOSIS — J4520 Mild intermittent asthma, uncomplicated: Secondary | ICD-10-CM

## 2013-12-23 DIAGNOSIS — R058 Other specified cough: Secondary | ICD-10-CM | POA: Insufficient documentation

## 2013-12-23 DIAGNOSIS — J329 Chronic sinusitis, unspecified: Secondary | ICD-10-CM | POA: Insufficient documentation

## 2013-12-23 HISTORY — DX: Mild intermittent asthma, uncomplicated: J45.20

## 2013-12-23 NOTE — Assessment & Plan Note (Signed)
Continue prn albuterol

## 2013-12-23 NOTE — Progress Notes (Signed)
Chief Complaint  Patient presents with  . Cough    Cough has improved slightly. Reports sinus congestion. PFT done today.    History of Present Illness: Patricia Owen is a 48 y.o. 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  She is here to review PFT.  This was normal.  Her cough is much improved.  She still gets post-nasal drip, and gagging in the morning.  She still has to clear her throat from post-nasal.  She uses proair occasionally and this helps.  She is thinking about learning how to scuba dive, but is concerned about not being able to equalize her ear pressures.   TESTS: CXR 11/27/13 >> normal PFT 12/23/13 >> FEV1 3.22 (111%), FEV1% 83, TLC 5.38 (106%), DLCO 86%, no Bd  Patricia Owen  has a past medical history of Endometriosis.  Patricia Owen  has past surgical history that includes Broken Ankle repair.  Prior to Admission medications   Medication Sig Start Date End Date Taking? Authorizing Provider  albuterol (PROAIR HFA) 108 (90 BASE) MCG/ACT inhaler Inhale 2 puffs into the lungs every 6 (six) hours as needed for wheezing or shortness of breath. 11/18/13   Chesley Mires, MD  cholecalciferol (VITAMIN D) 1000 UNITS tablet Take 2,000 Units by mouth daily.    Historical Provider, MD  fluconazole (DIFLUCAN) 150 MG tablet Take 1 tablet (150 mg total) by mouth once. 12/10/13   Anastasio Auerbach, MD  fluticasone (FLONASE) 50 MCG/ACT nasal spray Place 2 sprays into both nostrils daily. 11/18/13   Chesley Mires, MD  norethindrone-ethinyl estradiol (JUNEL FE 1/20) 1-20 MG-MCG tablet TAKE 1 TABLET BY MOUTH ONCE A DAY 12/09/13   Anastasio Auerbach, MD    Allergies  Allergen Reactions  . Benadryl [Diphenhydramine Hcl]   . Codeine   . Prednisone      Physical Exam:  General - No distress ENT - No sinus tenderness, no oral exudate, no LAN, nasal septal deviation Cardiac - s1s2 regular, no murmur Chest - No wheeze/rales/dullness Back - No focal  tenderness Abd - Soft, non-tender Ext - No edema Neuro - Normal strength Skin - No rashes Psych - normal mood, and behavior   Assessment/Plan:  Chesley Mires, MD [facility] Pager:  [phone]

## 2013-12-23 NOTE — Assessment & Plan Note (Signed)
Related to upper airway cough with post-nasal drip, nasal septal deviation, and mild asthma.  Improved.

## 2013-12-23 NOTE — Assessment & Plan Note (Signed)
Continue sinus regimen.

## 2013-12-23 NOTE — Assessment & Plan Note (Signed)
Will arrange for ENT referral.  Thought is that with correction of her septal deviation this could help control her chronic rhino-sinusitis, post-nasal drip, and cough.

## 2013-12-23 NOTE — Patient Instructions (Signed)
Will arrange for referral to ENT  Follow up in 4 months 

## 2013-12-23 NOTE — Progress Notes (Signed)
PFT done today. 

## 2013-12-23 NOTE — Assessment & Plan Note (Signed)
She is to continue nasal irrigation and flonase.

## 2014-03-28 LAB — PULMONARY FUNCTION TEST
DL/VA % pred: 93 %
DL/VA: 4.51 ml/min/mmHg/L
DLCO unc % pred: 86 %
DLCO unc: 21.05 ml/min/mmHg
FEF 25-75 Post: 3.68 L/sec
FEF 25-75 Pre: 3.04 L/sec
FEF2575-%CHANGE-POST: 21 %
FEF2575-%PRED-POST: 127 %
FEF2575-%Pred-Pre: 104 %
FEV1-%Change-Post: 2 %
FEV1-%Pred-Post: 111 %
FEV1-%Pred-Pre: 108 %
FEV1-POST: 3.22 L
FEV1-Pre: 3.13 L
FEV1FVC-%Change-Post: 3 %
FEV1FVC-%Pred-Pre: 99 %
FEV6-%Change-Post: 0 %
FEV6-%Pred-Post: 110 %
FEV6-%Pred-Pre: 110 %
FEV6-PRE: 3.88 L
FEV6-Post: 3.87 L
FEV6FVC-%Change-Post: 0 %
FEV6FVC-%PRED-PRE: 101 %
FEV6FVC-%Pred-Post: 102 %
FVC-%CHANGE-POST: 0 %
FVC-%PRED-POST: 107 %
FVC-%Pred-Pre: 107 %
FVC-POST: 3.87 L
FVC-PRE: 3.9 L
POST FEV1/FVC RATIO: 83 %
PRE FEV6/FVC RATIO: 100 %
Post FEV6/FVC ratio: 100 %
Pre FEV1/FVC ratio: 80 %
RV % PRED: 97 %
RV: 1.67 L
TLC % pred: 106 %
TLC: 5.38 L

## 2014-05-29 HISTORY — PX: SEPTOPLASTY: SUR1290

## 2014-05-29 HISTORY — PX: TURBINATE REDUCTION: SHX6157

## 2014-05-29 HISTORY — PX: BALLOON SINUPLASTY: SHX5740

## 2014-06-12 ENCOUNTER — Encounter: Payer: Self-pay | Admitting: Pulmonary Disease

## 2014-11-17 ENCOUNTER — Other Ambulatory Visit: Payer: Self-pay | Admitting: Gynecology

## 2014-12-24 ENCOUNTER — Other Ambulatory Visit: Payer: Self-pay | Admitting: Gynecology

## 2015-01-17 ENCOUNTER — Other Ambulatory Visit: Payer: Self-pay | Admitting: Gynecology

## 2015-02-02 ENCOUNTER — Encounter: Payer: Self-pay | Admitting: Gynecology

## 2015-02-02 ENCOUNTER — Ambulatory Visit (INDEPENDENT_AMBULATORY_CARE_PROVIDER_SITE_OTHER): Payer: BLUE CROSS/BLUE SHIELD | Admitting: Gynecology

## 2015-02-02 VITALS — BP 115/72 | Ht 65.0 in | Wt 189.0 lb

## 2015-02-02 DIAGNOSIS — N926 Irregular menstruation, unspecified: Secondary | ICD-10-CM | POA: Diagnosis not present

## 2015-02-02 DIAGNOSIS — Z01419 Encounter for gynecological examination (general) (routine) without abnormal findings: Secondary | ICD-10-CM | POA: Diagnosis not present

## 2015-02-02 LAB — CBC WITH DIFFERENTIAL/PLATELET
BASOS ABS: 0.1 10*3/uL (ref 0.0–0.1)
Basophils Relative: 1 % (ref 0–1)
EOS ABS: 0.1 10*3/uL (ref 0.0–0.7)
Eosinophils Relative: 1 % (ref 0–5)
HEMATOCRIT: 41.2 % (ref 36.0–46.0)
HEMOGLOBIN: 13.5 g/dL (ref 12.0–15.0)
LYMPHS PCT: 28 % (ref 12–46)
Lymphs Abs: 2.5 10*3/uL (ref 0.7–4.0)
MCH: 28.6 pg (ref 26.0–34.0)
MCHC: 32.8 g/dL (ref 30.0–36.0)
MCV: 87.3 fL (ref 78.0–100.0)
MPV: 10.8 fL (ref 8.6–12.4)
Monocytes Absolute: 0.6 10*3/uL (ref 0.1–1.0)
Monocytes Relative: 7 % (ref 3–12)
NEUTROS ABS: 5.5 10*3/uL (ref 1.7–7.7)
Neutrophils Relative %: 63 % (ref 43–77)
PLATELETS: 283 10*3/uL (ref 150–400)
RBC: 4.72 MIL/uL (ref 3.87–5.11)
RDW: 13.3 % (ref 11.5–15.5)
WBC: 8.8 10*3/uL (ref 4.0–10.5)

## 2015-02-02 LAB — COMPREHENSIVE METABOLIC PANEL
ALK PHOS: 52 U/L (ref 39–117)
ALT: 11 U/L (ref 0–35)
AST: 13 U/L (ref 0–37)
Albumin: 4.1 g/dL (ref 3.5–5.2)
BUN: 11 mg/dL (ref 6–23)
CO2: 25 meq/L (ref 19–32)
CREATININE: 0.76 mg/dL (ref 0.50–1.10)
Calcium: 9.4 mg/dL (ref 8.4–10.5)
Chloride: 101 mEq/L (ref 96–112)
Glucose, Bld: 80 mg/dL (ref 70–99)
POTASSIUM: 5.3 meq/L (ref 3.5–5.3)
SODIUM: 137 meq/L (ref 135–145)
TOTAL PROTEIN: 7.4 g/dL (ref 6.0–8.3)
Total Bilirubin: 0.3 mg/dL (ref 0.2–1.2)

## 2015-02-02 LAB — LIPID PANEL
CHOLESTEROL: 125 mg/dL (ref 0–200)
HDL: 57 mg/dL (ref >=46)
LDL CALC: 33 mg/dL (ref 0–99)
Total CHOL/HDL Ratio: 2.2 Ratio
Triglycerides: 173 mg/dL — ABNORMAL HIGH (ref <150)
VLDL: 35 mg/dL (ref 0–40)

## 2015-02-02 LAB — TSH: TSH: 1.389 u[IU]/mL (ref 0.350–4.500)

## 2015-02-02 NOTE — Progress Notes (Addendum)
Raechal Raben [birth year] [id number]        49 y.o.  G0P0 for annual exam.  Several issues noted below.  Past medical history,surgical history, problem list, medications, allergies, family history and social history were all reviewed and documented as reviewed in the EPIC chart.  ROS:  Performed with pertinent positives and negatives included in the history, assessment and plan.   Additional significant findings :  none   Exam: Administrator, [employer] Vitals:   02/02/15 1155  BP: 115/72  Height: 5\' 5"  (1.651 m)  Weight: 189 lb (85.73 kg)   General appearance:  Normal affect, orientation and appearance. Skin: Grossly normal HEENT: Without gross lesions.  No cervical or supraclavicular adenopathy. Thyroid normal.  Lungs:  Clear without wheezing, rales or rhonchi Cardiac: RR, without RMG Abdominal:  Soft, nontender, without masses, guarding, rebound, organomegaly or hernia Breasts:  Examined lying and sitting without masses, retractions, discharge or axillary adenopathy. Pelvic:  Ext/BUS/vagina normal  Cervix normal  Uterus axial to anteverted, normal size, shape and contour, midline and mobile nontender   Adnexa  Without masses or tenderness    Anus and perineum  Normal   Rectovaginal  Normal sphincter tone without palpated masses or tenderness.    Assessment/Plan:  49 y.o. G0P0 female for annual exam with irregular bleeding over the last month, oral contraceptives.   1. Irregular menses. Patient on low-dose oral contraceptives due to irregular bleeding, recurrent ovarian cysts and pelvic pain. Have been diagnosed with endometriosis although she never had surgery and this was based on an ultrasound showing ovarian cystic changes in her pain.  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 Right left ovaries with physiologic cystic changes. Endometrial echo 3.8 mm. Sonohysterogram without cavitary abnormalities. Biopsy  showed secretory endometrium. Had some irregular bleeding last year where TSH was normal FSH 27. Started bleeding and may continue to bleed off and on for the whole month despite continuing her oral contraceptives. Exam today is normal.  Will recheck hormone levels to include TSH prolactin and FSH and baseline sonohysterogram for cavitary and ovarian surveillance. Discussed options with her and her husband to include continued low-dose or increasing the dose of her oral contraceptives to try to override the endometrium. Alternatives to include a Mirena IUD discussed. I encouraged them to consider Mirena IUD. I reviewed the insertional process and the risks. They will think about this and follow up for the ultrasound blood work.  Also reviewed the risks of oral contraceptives to include increased risk of stroke heart attack DVT. 2. Pap smear/HPV negative 11/2013. No Pap smear done today. No history of significant abnormal Pap smears previously. 3. Mammography never. I again strongly recommended screening mammography. Breast cancer the most common cancer in women stressed. The patient agrees to schedule this. SBE monthly reviewed. 4. Health maintenance. Baseline CBC comprehensive metabolic panel lipid profile urinalysis ordered with the above blood work. Follow up for the sonohysterogram.     Anastasio Auerbach MD, 12:42 PM 02/02/2015

## 2015-02-02 NOTE — Patient Instructions (Signed)
Follow up for the ultrasound as scheduled.  Call to Schedule your mammogram  Facilities in [place]: 1)  [facility], [place] Phone: [phone])  [facility]. [address] [place] Phone: [phone])  Dr. Isaiah Blakes at [facility] [address] Phone: [phone]     Mammogram A mammogram is an X-ray test to find changes in a woman's breast. You should get a mammogram if:  You are 59 years of age or older  You have risk factors.   Your doctor recommends that you have one.  BEFORE THE TEST  Do not schedule the test the week before your period, especially if your breasts are sore during this time.  On the day of your mammogram:  Wash your breasts and armpits well. After washing, do not put on any deodorant or talcum powder on until after your test.   Eat and drink as you usually do.   Take your medicines as usual.   If you are diabetic and take insulin, make sure you:   Eat before coming for your test.   Take your insulin as usual.   If you cannot keep your appointment, call before the appointment to cancel. Schedule another appointment.  TEST  You will need to undress from the waist up. You will put on a hospital gown.   Your breast will be put on the mammogram machine, and it will press firmly on your breast with a piece of plastic called a compression paddle. This will make your breast flatter so that the machine can X-ray all parts of your breast.   Both breasts will be X-rayed. Each breast will be X-rayed from above and from the side. An X-ray might need to be taken again if the picture is not good enough.   The mammogram will last about 15 to 30 minutes.  AFTER THE TEST Finding out the results of your test Ask when your test results will be ready. Make sure you get your test results.  Document Released: 11/11/2008 Document Revised: 08/04/2011  Document Reviewed: 11/11/2008 [facility] Patient Information 2012 [place].  You may obtain a copy of any labs that were done today by logging onto MyChart as outlined in the instructions provided with your AVS (after visit summary). The office will not call with normal lab results but certainly if there are any significant abnormalities then we will contact you.   Health Maintenance, Female A healthy lifestyle and preventative care can promote health and wellness.  Maintain regular health, dental, and eye exams.  Eat a healthy diet. Foods like vegetables, fruits, whole grains, low-fat dairy products, and lean protein foods contain the nutrients you need without too many calories. Decrease your intake of foods high in solid fats, added sugars, and salt. Get information about a proper diet from your caregiver, if necessary.  Regular physical exercise is one of the most important things you can do for your health. Most adults should get at least 150 minutes of moderate-intensity exercise (any activity that increases your heart rate and causes you to sweat) each week. In addition, most adults need muscle-strengthening exercises on 2 or more days a week.   Maintain a healthy weight. The body mass index (BMI) is a screening tool to identify possible weight problems. It provides an estimate of body fat based on height and weight. Your caregiver can help determine your BMI, and can help you  achieve or maintain a healthy weight. For adults 20 years and older:  A BMI below 18.5 is considered underweight.  A BMI of 18.5 to 24.9 is normal.  A BMI of 25 to 29.9 is considered overweight.  A BMI of 30 and above is considered obese.  Maintain normal blood lipids and cholesterol by exercising and minimizing your intake of saturated fat. Eat a balanced diet with plenty of fruits and vegetables. Blood tests for lipids and cholesterol should begin at age 55 and be repeated every 5 years. If your lipid or  cholesterol levels are high, you are over 50, or you are a high risk for heart disease, you may need your cholesterol levels checked more frequently.Ongoing high lipid and cholesterol levels should be treated with medicines if diet and exercise are not effective.  If you smoke, find out from your caregiver how to quit. If you do not use tobacco, do not start.  Lung cancer screening is recommended for adults aged 24 80 years who are at high risk for developing lung cancer because of a history of smoking. Yearly low-dose computed tomography (CT) is recommended for people who have at least a 30-pack-year history of smoking and are a current smoker or have quit within the past 15 years. A pack year of smoking is smoking an average of 1 pack of cigarettes a day for 1 year (for example: 1 pack a day for 30 years or 2 packs a day for 15 years). Yearly screening should continue until the smoker has stopped smoking for at least 15 years. Yearly screening should also be stopped for people who develop a health problem that would prevent them from having lung cancer treatment.  If you are pregnant, do not drink alcohol. If you are breastfeeding, be very cautious about drinking alcohol. If you are not pregnant and choose to drink alcohol, do not exceed 1 drink per day. One drink is considered to be 12 ounces (355 mL) of beer, 5 ounces (148 mL) of wine, or 1.5 ounces (44 mL) of liquor.  Avoid use of street drugs. Do not share needles with anyone. Ask for help if you need support or instructions about stopping the use of drugs.  High blood pressure causes heart disease and increases the risk of stroke. Blood pressure should be checked at least every 1 to 2 years. Ongoing high blood pressure should be treated with medicines, if weight loss and exercise are not effective.  If you are 45 to 48 years old, ask your caregiver if you should take aspirin to prevent strokes.  Diabetes screening involves taking a blood sample  to check your fasting blood sugar level. This should be done once every 3 years, after age 4, if you are within normal weight and without risk factors for diabetes. Testing should be considered at a younger age or be carried out more frequently if you are overweight and have at least 1 risk factor for diabetes.  Breast cancer screening is essential preventative care for women. You should practice "breast self-awareness." This means understanding the normal appearance and feel of your breasts and may include breast self-examination. Any changes detected, no matter how small, should be reported to a caregiver. Women in their 76s and 30s should have a clinical breast exam (CBE) by a caregiver as part of a regular health exam every 1 to 3 years. After age 33, women should have a CBE every year. Starting at age 20, women should consider having a mammogram (  breast X-ray) every year. Women who have a family history of breast cancer should talk to their caregiver about genetic screening. Women at a high risk of breast cancer should talk to their caregiver about having an MRI and a mammogram every year.  Breast cancer gene (BRCA)-related cancer risk assessment is recommended for women who have family members with BRCA-related cancers. BRCA-related cancers include breast, ovarian, tubal, and peritoneal cancers. Having family members with these cancers may be associated with an increased risk for harmful changes (mutations) in the breast cancer genes BRCA1 and BRCA2. Results of the assessment will determine the need for genetic counseling and BRCA1 and BRCA2 testing.  The Pap test is a screening test for cervical cancer. Women should have a Pap test starting at age 7. Between ages 27 and 43, Pap tests should be repeated every 2 years. Beginning at age 57, you should have a Pap test every 3 years as long as the past 3 Pap tests have been normal. If you had a hysterectomy for a problem that was not cancer or a condition  that could lead to cancer, then you no longer need Pap tests. If you are between ages 33 and 47, and you have had normal Pap tests going back 10 years, you no longer need Pap tests. If you have had past treatment for cervical cancer or a condition that could lead to cancer, you need Pap tests and screening for cancer for at least 20 years after your treatment. If Pap tests have been discontinued, risk factors (such as a new sexual partner) need to be reassessed to determine if screening should be resumed. Some women have medical problems that increase the chance of getting cervical cancer. In these cases, your caregiver may recommend more frequent screening and Pap tests.  The human papillomavirus (HPV) test is an additional test that may be used for cervical cancer screening. The HPV test looks for the virus that can cause the cell changes on the cervix. The cells collected during the Pap test can be tested for HPV. The HPV test could be used to screen women aged 46 years and older, and should be used in women of any age who have unclear Pap test results. After the age of 22, women should have HPV testing at the same frequency as a Pap test.  Colorectal cancer can be detected and often prevented. Most routine colorectal cancer screening begins at the age of 58 and continues through age 64. However, your caregiver may recommend screening at an earlier age if you have risk factors for colon cancer. On a yearly basis, your caregiver may provide home test kits to check for hidden blood in the stool. Use of a small camera at the end of a tube, to directly examine the colon (sigmoidoscopy or colonoscopy), can detect the earliest forms of colorectal cancer. Talk to your caregiver about this at age 67, when routine screening begins. Direct examination of the colon should be repeated every 5 to 10 years through age 24, unless early forms of pre-cancerous polyps or small growths are found.  Hepatitis C blood testing is  recommended for all people born from [birth year] through 1964 and any individual with known risks for hepatitis C.  Practice safe sex. Use condoms and avoid high-risk sexual practices to reduce the spread of sexually transmitted infections (STIs). Sexually active women aged 47 and younger should be checked for Chlamydia, which is a common sexually transmitted infection. Older women with new or multiple partners  should also be tested for Chlamydia. Testing for other STIs is recommended if you are sexually active and at increased risk.  Osteoporosis is a disease in which the bones lose minerals and strength with aging. This can result in serious bone fractures. The risk of osteoporosis can be identified using a bone density scan. Women ages 75 and over and women at risk for fractures or osteoporosis should discuss screening with their caregivers. Ask your caregiver whether you should be taking a calcium supplement or vitamin D to reduce the rate of osteoporosis.  Menopause can be associated with physical symptoms and risks. Hormone replacement therapy is available to decrease symptoms and risks. You should talk to your caregiver about whether hormone replacement therapy is right for you.  Use sunscreen. Apply sunscreen liberally and repeatedly throughout the day. You should seek shade when your shadow is shorter than you. Protect yourself by wearing long sleeves, pants, a wide-brimmed hat, and sunglasses year round, whenever you are outdoors.  Notify your caregiver of new moles or changes in moles, especially if there is a change in shape or color. Also notify your caregiver if a mole is larger than the size of a pencil eraser.  Stay current with your immunizations. Document Released: 02/28/2011 Document Revised: 12/10/2012 Document Reviewed: 02/28/2011 [facility] Patient Information 2014 [place].

## 2015-02-03 LAB — URINALYSIS W MICROSCOPIC + REFLEX CULTURE
Bilirubin Urine: NEGATIVE
Casts: NONE SEEN
Crystals: NONE SEEN
Glucose, UA: NEGATIVE mg/dL
Hgb urine dipstick: NEGATIVE
Ketones, ur: NEGATIVE mg/dL
LEUKOCYTES UA: NEGATIVE
NITRITE: NEGATIVE
PH: 6.5 (ref 5.0–8.0)
PROTEIN: NEGATIVE mg/dL
SQUAMOUS EPITHELIAL / LPF: NONE SEEN
Specific Gravity, Urine: 1.01 (ref 1.005–1.030)
Urobilinogen, UA: 0.2 mg/dL (ref 0.0–1.0)

## 2015-02-03 LAB — FOLLICLE STIMULATING HORMONE: FSH: 28.1 m[IU]/mL

## 2015-02-03 LAB — PROLACTIN: Prolactin: 6.9 ng/mL

## 2015-02-06 ENCOUNTER — Other Ambulatory Visit: Payer: Self-pay | Admitting: Gynecology

## 2015-02-06 DIAGNOSIS — N939 Abnormal uterine and vaginal bleeding, unspecified: Secondary | ICD-10-CM

## 2015-02-27 ENCOUNTER — Telehealth: Payer: Self-pay | Admitting: Gynecology

## 2015-02-27 NOTE — Telephone Encounter (Signed)
02/27/15-Pt was advised today that her [facility] ins is putting the entire cost of the sonohysterogram towards her $6850.00 deductible. Her resposibility is $851.58 for the test and an additional $244.51 if bx is needed. She will pay $400 upfront and make payments on balance.wl

## 2015-03-04 ENCOUNTER — Other Ambulatory Visit: Payer: Self-pay | Admitting: Gynecology

## 2015-03-04 ENCOUNTER — Encounter: Payer: Self-pay | Admitting: Gynecology

## 2015-03-04 ENCOUNTER — Ambulatory Visit (INDEPENDENT_AMBULATORY_CARE_PROVIDER_SITE_OTHER): Payer: BLUE CROSS/BLUE SHIELD

## 2015-03-04 ENCOUNTER — Ambulatory Visit (INDEPENDENT_AMBULATORY_CARE_PROVIDER_SITE_OTHER): Payer: BLUE CROSS/BLUE SHIELD | Admitting: Gynecology

## 2015-03-04 VITALS — BP 120/76

## 2015-03-04 DIAGNOSIS — D251 Intramural leiomyoma of uterus: Secondary | ICD-10-CM

## 2015-03-04 DIAGNOSIS — N939 Abnormal uterine and vaginal bleeding, unspecified: Secondary | ICD-10-CM

## 2015-03-04 DIAGNOSIS — N926 Irregular menstruation, unspecified: Secondary | ICD-10-CM

## 2015-03-04 NOTE — Patient Instructions (Signed)
Office will call you with biopsy results. Follow up for the Mirena IUD as you choose.

## 2015-03-04 NOTE — Progress Notes (Signed)
Patricia Owen Jan 19, 1966 [id number]        49 y.o.  G0P0 Presents for sonohysterogram with history of irregular bleeding.  Currently on low-dose oral contraceptives but has breakthrough bleeding throughout the month. Was diagnosed with endometriosis without surgery based on ultrasound years ago. Had sonohysterogram 2013 due to irregular bleeding which showed several small myomas but otherwise negative. Recent TSH normal with FSH 28.  Past medical history,surgical history, problem list, medications, allergies, family history and social history were all reviewed and documented in the EPIC chart.  Directed ROS with pertinent positives and negatives documented in the history of present illness/assessment and plan.  Exam: [place] assistant Filed Vitals:   03/04/15 1237  BP: 120/76   General appearance:  Normal Pelvic external BUS vagina normal. Cervix normal.  Ultrasound shows uterus normal size and echotexture. 2 small myomas 38 mm and 14 mm. Endometrial echo 2.9 mm. Right and left ovaries grossly normal. Cul-de-sac negative.  Sonohysterogram performed, sterile technique, easy catheter introduction, good distention with no abnormalities. Endometrial sample taken. Patient tolerated well.  Assessment/Plan:  49 y.o. G0P0 with history as above. Sonohysterogram negative. Patient will follow for results in several days. Options for bleeding control reviewed to include expectant management, adjustment of her BCP dosage, Mirena IUD, to including hysterectomy. I again recommended they consider the Mirena IUD. We reviewed the risks/benefits. I discussed the risks of BCPs to include thrombosis such as stroke heart attack DVT. After lengthy discussion they're considering the Mirena IUD and will follow up for this.    Anastasio Auerbach MD, 12:52 PM 03/04/2015

## 2015-03-05 ENCOUNTER — Telehealth: Payer: Self-pay | Admitting: Gynecology

## 2015-03-05 NOTE — Telephone Encounter (Signed)
03/05/15-I LM VM for pt to call to give her the Mirena benefits from her [facility] ins. It will cover the device and insertion at 100% for contraception. Needs to be inserted while on cycle.wl

## 2015-03-26 ENCOUNTER — Telehealth: Payer: Self-pay | Admitting: *Deleted

## 2015-03-26 NOTE — Telephone Encounter (Signed)
Pt called c/o being moody and snappy at family and friend, pt coming in on 03/30/15 for mirena IUD, I advised to mention to Dr.[name] to see what his thoughts are regarding this.

## 2015-03-30 ENCOUNTER — Encounter: Payer: Self-pay | Admitting: Gynecology

## 2015-03-30 ENCOUNTER — Ambulatory Visit (INDEPENDENT_AMBULATORY_CARE_PROVIDER_SITE_OTHER): Payer: BLUE CROSS/BLUE SHIELD | Admitting: Gynecology

## 2015-03-30 VITALS — BP 116/76

## 2015-03-30 DIAGNOSIS — Z30430 Encounter for insertion of intrauterine contraceptive device: Secondary | ICD-10-CM | POA: Diagnosis not present

## 2015-03-30 DIAGNOSIS — N912 Amenorrhea, unspecified: Secondary | ICD-10-CM

## 2015-03-30 HISTORY — PX: INTRAUTERINE DEVICE (IUD) INSERTION: SHX5877

## 2015-03-30 LAB — PREGNANCY, URINE: PREG TEST UR: NEGATIVE

## 2015-03-30 MED ORDER — LEVONORGESTREL 20 MCG/24HR IU IUD: INTRAUTERINE_SYSTEM | Freq: Once | INTRAUTERINE | Status: AC

## 2015-03-30 NOTE — Progress Notes (Addendum)
Patient presents for Mirena IUD placement. She has read through the booklet, has no contraindications and signed the consent form. She currently is on oral contraceptives and is over 3 weeks from her last coital episode with a negative hCG.  I reviewed the insertional process with her as well as the risks to include infection, either immediate or long-term, uterine perforation or migration requiring surgery to remove, other complications such as pain, hormonal side effects, infertility and possibility of failure with subsequent pregnancy.   Exam with Kim assistant Pelvic: External BUS vagina normal. Cervix normal . Uterus anteverted normal size shape contour midline mobile nontender. Adnexa without masses or tenderness.  Procedure: The cervix was cleansed with Betadine, anterior lip grasped with a single-tooth tenaculum and the uterus was attempted to be sounded with resistance at the internal cervical os. The cervix was dilated with a disposable dilator and subsequently sounded without difficulty and a Mirena IUD was placed according to manufacturer's recommendations without difficulty. The strings were trimmed. The patient tolerated well and will follow up in one month for a postinsertional check.  Lot number:  [id number]  Addendum: After the visit the patient returned as me a question she had forgotten talk about. She notes she started to have some night sweats and emotional swings. Her [place] was marginally elevated previously. I reviewed the issues of perimenopause and options to include OTC product trial such as soy based, HRT or nonhormonal pharmacologic such as Effexor. Patient wants to try OTC soy based for now. Will follow up in one month for her IUD check and we will rediscuss and see how she's doing.    Anastasio Auerbach MD, 8:45 AM 03/30/2015

## 2015-03-30 NOTE — Patient Instructions (Signed)
Intrauterine Device Insertion Most often, an intrauterine device (IUD) is inserted into the uterus to prevent pregnancy. There are 2 types of IUDs available:  Copper IUD--This type of IUD creates an environment that is not favorable to sperm survival. The mechanism of action of the copper IUD is not known for certain. It can stay in place for 10 years.  Hormone IUD--This type of IUD contains the hormone progestin (synthetic progesterone). The progestin thickens the cervical mucus and prevents sperm from entering the uterus, and it also thins the uterine lining. There is no evidence that the hormone IUD prevents implantation. One hormone IUD can stay in place for up to 5 years, and a different hormone IUD can stay in place for up to 3 years. An IUD is the most cost-effective birth control if left in place for the full duration. It may be removed at any time. LET YOUR HEALTH CARE PROVIDER KNOW ABOUT:  Any allergies you have.  All medicines you are taking, including vitamins, herbs, eye drops, creams, and over-the-counter medicines.  Previous problems you or members of your family have had with the use of anesthetics.  Any blood disorders you have.  Previous surgeries you have had.  Possibility of pregnancy.  Medical conditions you have. RISKS AND COMPLICATIONS  Generally, intrauterine device insertion is a safe procedure. However, as with any procedure, complications can occur. Possible complications include:  Accidental puncture (perforation) of the uterus.  Accidental placement of the IUD either in the muscle layer of the uterus (myometrium) or outside the uterus. If this happens, the IUD can be found essentially floating around the bowels and must be taken out surgically.  The IUD may fall out of the uterus (expulsion). This is more common in women who have recently had a child.   Pregnancy in the fallopian tube (ectopic).  Pelvic inflammatory disease (PID), which is infection of  the uterus and fallopian tubes. The risk of PID is slightly increased in the first 20 days after the IUD is placed, but the overall risk is still very low. BEFORE THE PROCEDURE  Schedule the IUD insertion for when you will have your menstrual period or right after, to make sure you are not pregnant. Placement of the IUD is better tolerated shortly after a menstrual cycle.  You may need to take tests or be examined to make sure you are not pregnant.  You may be required to take a pregnancy test.  You may be required to get checked for sexually transmitted infections (STIs) prior to placement. Placing an IUD in someone who has an infection can make the infection worse.  You may be given a pain reliever to take 1 or 2 hours before the procedure.  An exam will be performed to determine the size and position of your uterus.  Ask your health care provider about changing or stopping your regular medicines. PROCEDURE   A tool (speculum) is placed in the vagina. This allows your health care provider to see the lower part of the uterus (cervix).  The cervix is prepped with a medicine that lowers the risk of infection.  You may be given a medicine to numb each side of the cervix (intracervical or paracervical block). This is used to block and control any discomfort with insertion.  A tool (uterine sound) is inserted into the uterus to determine the length of the uterine cavity and the direction the uterus may be tilted.  A slim instrument (IUD inserter) is inserted through the cervical   canal and into your uterus.  The IUD is placed in the uterine cavity and the insertion device is removed.  The nylon string that is attached to the IUD and used for eventual IUD removal is trimmed. It is trimmed so that it lays high in the vagina, just outside the cervix. AFTER THE PROCEDURE  You may have bleeding after the procedure. This is normal. It varies from light spotting for a few days to menstrual-like  bleeding.  You may have mild cramping. Document Released: 04/13/2011 Document Revised: 06/05/2013 Document Reviewed: 02/03/2013 [employer] Patient Information 2015 [employer]. This information is not intended to replace advice given to you by your health care provider. Make sure you discuss any questions you have with your health care provider.  

## 2015-05-01 ENCOUNTER — Encounter: Payer: Self-pay | Admitting: Gynecology

## 2015-05-01 ENCOUNTER — Ambulatory Visit (INDEPENDENT_AMBULATORY_CARE_PROVIDER_SITE_OTHER): Payer: BLUE CROSS/BLUE SHIELD | Admitting: Gynecology

## 2015-05-01 VITALS — BP 118/76

## 2015-05-01 DIAGNOSIS — Z30431 Encounter for routine checking of intrauterine contraceptive device: Secondary | ICD-10-CM | POA: Diagnosis not present

## 2015-05-01 NOTE — Patient Instructions (Addendum)
Follow up for your annual exam when due, sooner if any issues.

## 2015-05-01 NOTE — Progress Notes (Signed)
Patricia Owen [birth year] [id number]        49 y.o.  G0P0 presents for follow up IUD check. Has done well without any bleeding excepting several days of spotting since her placement last month.  Past medical history,surgical history, problem list, medications, allergies, family history and social history were all reviewed and documented in the EPIC chart.  Directed ROS with pertinent positives and negatives documented in the history of present illness/assessment and plan.  Exam: [place] assistant Filed Vitals:   05/01/15 1152  BP: 118/76   General appearance:  Normal Abdomen soft nontender without masses guarding rebound Pelvic external BUS vagina normal. Cervix normal with IUD strings visualized in appropriate length. Uterus normal size midline mobile nontender. Adnexa without masses or tenderness.  Assessment/Plan:  49 y.o. G0P0 with normal follow up IUD check. Keep menstrual calendar and follow up if any issues otherwise follow up in June 2017 when due for her annual exam.    Anastasio Auerbach MD, 11:53 AM 05/01/2015

## 2016-02-03 ENCOUNTER — Encounter: Payer: BLUE CROSS/BLUE SHIELD | Admitting: Gynecology

## 2016-02-10 ENCOUNTER — Encounter: Payer: BLUE CROSS/BLUE SHIELD | Admitting: Gynecology

## 2016-02-24 ENCOUNTER — Telehealth: Payer: Self-pay | Admitting: *Deleted

## 2016-02-24 DIAGNOSIS — R109 Unspecified abdominal pain: Secondary | ICD-10-CM

## 2016-02-24 DIAGNOSIS — R197 Diarrhea, unspecified: Secondary | ICD-10-CM

## 2016-02-24 NOTE — Telephone Encounter (Signed)
Not a typical GYN issue.  Sounds like she needs a stool culture for ova and parasites. I would probably recommend getting her into see a gastroenterologist ASAP

## 2016-02-24 NOTE — Telephone Encounter (Signed)
Pt started taking Tumeric supplement for 2 weeks now, states on Monday she noticed what appears to be dead worms in her stool.  Pt does admit she has been eating not fully cooked salmon and tuna, lots of seafood, c/o diarrhea, and abdominal discomfort since Monday. Pt is still taking medication questioning if this could be related to the supplements, she read online that turmeric can help cleanse parasites out of the body. Pt doesn't have a PCP, states urgent wouldn't know what to check her for if she did go there. Would like you recommendations of what to do? Please advise

## 2016-02-24 NOTE — Telephone Encounter (Signed)
Left on pt cell # the referral will be placed and [facility] will be call to schedule.

## 2016-02-25 ENCOUNTER — Telehealth: Payer: Self-pay | Admitting: Gastroenterology

## 2016-02-26 NOTE — Telephone Encounter (Signed)
Left message on machine to call back  

## 2016-02-26 NOTE — Telephone Encounter (Signed)
Pt has abd pain and diarrhea, possible parasites, she thinks she has some type of worm in her stool.  She says she just started seeing them on 12/01/22.  She sees dead worms with every stool.  Appt with Dr Loletha Carrow on 03/04/16

## 2016-02-29 NOTE — Telephone Encounter (Signed)
appointment 03/04/16 with Dr. Loletha Carrow

## 2016-03-04 ENCOUNTER — Ambulatory Visit (INDEPENDENT_AMBULATORY_CARE_PROVIDER_SITE_OTHER): Payer: BLUE CROSS/BLUE SHIELD | Admitting: Gastroenterology

## 2016-03-04 ENCOUNTER — Encounter: Payer: Self-pay | Admitting: Gastroenterology

## 2016-03-04 VITALS — BP 130/86 | HR 84 | Ht 64.5 in | Wt 192.2 lb

## 2016-03-04 DIAGNOSIS — R197 Diarrhea, unspecified: Secondary | ICD-10-CM | POA: Diagnosis not present

## 2016-03-04 DIAGNOSIS — R1032 Left lower quadrant pain: Secondary | ICD-10-CM

## 2016-03-04 NOTE — Progress Notes (Signed)
[facility] Consult Note:  History: Patricia Owen 03/04/2016  Referring physician: Anastasio Auerbach, MD  Reason for consult/chief complaint: change in bowel habits; Gastroesophageal Reflux; and Abdominal Pain   Subjective HPI:  This is the initial office consult for 50 year old woman referred by her gynecologist for recent abdominal pain, diarrhea, and concern for a stool parasitic infection. 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 She also typically tends toward constipation. About 10 days ago during a trip to [place] state for [employer], she had some worsening heartburn because she did not take her omeprazole. She but something over-the-counter for this, and also started having some crampy abdominal pain. Once she returned home, her stools were initially watery and then of a mushy consistency. She believes that on multiple occasions including just yesterday she saw what she believed to be flat worms in the stool. There also appeared to be white substances that she believed might be eggs sacs. There's been no rectal bleeding, and overall her diarrhea has slowly improved since it first started.  ROS:  Review of Systems  Constitutional: Negative for appetite change and unexpected weight change.  HENT: Negative for mouth sores and voice change.   Eyes: Negative for pain and redness.  Respiratory: Positive for cough. Negative for shortness of breath.   Cardiovascular: Negative for chest pain and palpitations.  Genitourinary: Negative for dysuria and hematuria.  Musculoskeletal: Negative for myalgias and arthralgias.  Skin: Negative for pallor and rash.  Allergic/Immunologic: Positive for environmental allergies.  Neurological: Positive for headaches. Negative for weakness.  Hematological: Negative for adenopathy.     Past Medical History: Past Medical History  Diagnosis Date  . Endometriosis   . GERD  (gastroesophageal reflux disease)      Past Surgical History: Past Surgical History  Procedure Laterality Date  . Broken ankle repair    . Septoplasty  05/29/14  . Turbinate reduction  05/29/14    Inferior turbinate  . Balloon sinuplasty  05/29/14    Bilateral maxillary sinus  . Intrauterine device (iud) insertion  03/30/2015    Mirena     Family History: Family History  Problem Relation Age of Onset  . Diabetes Father   . Hypertension Father   . Heart disease Father     Social History: Social History   Social History  . Marital Status: Married    Spouse Name: N/A  . Number of Children: N/A  . Years of Education: N/A   Occupational History  . Self employeed    Social History Main Topics  . Smoking status: Never Smoker   . Smokeless tobacco: Never Used  . Alcohol Use: Yes     Comment: Rare  . Drug Use: No  . Sexual Activity: Not Currently    Birth Control/ Protection: IUD     Comment: Mirena 03/30/2015   Other Topics Concern  . None   Social History Narrative    Allergies: Allergies  Allergen Reactions  . Benadryl [Diphenhydramine Hcl]   . Codeine   . Prednisone     Outpatient Meds: Current Outpatient Prescriptions  Medication Sig Dispense Refill  . APPLE CIDER VINEGAR PO Take by mouth.    . Ascorbic Acid (VITAMIN C PO) Take by mouth.    . Cholecalciferol (VITAMIN D PO) Take by mouth.    . fluticasone (FLONASE) 50 MCG/ACT nasal spray Place 2 sprays into both nostrils daily. 16 g 2  . JUNEL FE 1/20 1-20 MG-MCG tablet TAKE 1 TABLET BY  MOUTH ONCE A DAY 28 tablet 0  . NON FORMULARY Tumeric    . Nutritional Supplements (ESTROVEN PO) Take by mouth.     Current Facility-Administered Medications  Medication Dose Route Frequency Provider Last Rate Last Dose  . levonorgestrel (MIRENA) 20 MCG/24HR IUD   Intrauterine Once Anastasio Auerbach, MD          ___________________________________________________________________ Objective  Exam:  BP 130/86  mmHg  Pulse 84  Ht 5' 4.5" (1.638 m)  Wt 192 lb 3.2 oz (87.181 kg)  BMI 32.49 kg/m2   General: this is a(n) Well-appearing middle-aged woman in no acute distress   Eyes: sclera anicteric, no redness  ENT: oral mucosa moist without lesions, no cervical or supraclavicular lymphadenopathy, good dentition  CV: RRR without murmur, S1/S2, no JVD, no peripheral edema  Resp: clear to auscultation bilaterally, normal RR and effort noted  GI: soft, mild LLQ tenderness, with active bowel sounds. No guarding or palpable organomegaly noted.  Skin; warm and dry, no rash or jaundice noted  Neuro: awake, alert and oriented x 3. Normal gross motor function and fluent speech   The patient showed me a stool sample that she brought from home. It is dark brown, soft and has a few small white flecks in it.  Assessment: Encounter Diagnoses  Name Primary?  . Diarrhea, unspecified type Yes  . LLQ abdominal pain   Recent onset of symptoms of unclear cause.  Plan:  Stool for ova and parasites. If negative, and her symptoms do not resolve in the next 7-10 days, schedule colonoscopy.  Thank you for the courtesy of this consult.  Please call me with any questions or concerns.  Patricia Owen  CC: Anastasio Auerbach, MD

## 2016-03-04 NOTE — Patient Instructions (Addendum)
Your physician has requested that you go to the basement for the following lab work before leaving today: O&P  If you are age 49 or older, your body mass index should be between 23-30. Your Body mass index is 32.49 kg/(m^2). If this is out of the aforementioned range listed, please consider follow up with your Primary Care Provider.  If you are age 54 or younger, your body mass index should be between 19-25. Your Body mass index is 32.49 kg/(m^2). If this is out of the aformentioned range listed, please consider follow up with your Primary Care Provider.

## 2016-03-07 ENCOUNTER — Other Ambulatory Visit: Payer: BLUE CROSS/BLUE SHIELD

## 2016-03-07 DIAGNOSIS — R1032 Left lower quadrant pain: Secondary | ICD-10-CM

## 2016-03-07 DIAGNOSIS — R197 Diarrhea, unspecified: Secondary | ICD-10-CM

## 2016-03-08 ENCOUNTER — Ambulatory Visit (INDEPENDENT_AMBULATORY_CARE_PROVIDER_SITE_OTHER): Payer: BLUE CROSS/BLUE SHIELD | Admitting: Gynecology

## 2016-03-08 ENCOUNTER — Encounter: Payer: Self-pay | Admitting: Gynecology

## 2016-03-08 VITALS — BP 120/74 | Ht 64.0 in | Wt 192.0 lb

## 2016-03-08 DIAGNOSIS — Z01419 Encounter for gynecological examination (general) (routine) without abnormal findings: Secondary | ICD-10-CM

## 2016-03-08 DIAGNOSIS — Z13220 Encounter for screening for lipoid disorders: Secondary | ICD-10-CM

## 2016-03-08 DIAGNOSIS — Z30431 Encounter for routine checking of intrauterine contraceptive device: Secondary | ICD-10-CM

## 2016-03-08 LAB — COMPREHENSIVE METABOLIC PANEL
ALBUMIN: 4.1 g/dL (ref 3.6–5.1)
ALT: 76 U/L — ABNORMAL HIGH (ref 6–29)
AST: 47 U/L — AB (ref 10–35)
Alkaline Phosphatase: 72 U/L (ref 33–115)
BUN: 11 mg/dL (ref 7–25)
CALCIUM: 9.2 mg/dL (ref 8.6–10.2)
CO2: 24 mmol/L (ref 20–31)
Chloride: 104 mmol/L (ref 98–110)
Creat: 0.8 mg/dL (ref 0.50–1.10)
Glucose, Bld: 101 mg/dL — ABNORMAL HIGH (ref 65–99)
Potassium: 4.1 mmol/L (ref 3.5–5.3)
Sodium: 140 mmol/L (ref 135–146)
TOTAL PROTEIN: 6.7 g/dL (ref 6.1–8.1)
Total Bilirubin: 0.5 mg/dL (ref 0.2–1.2)

## 2016-03-08 LAB — CBC WITH DIFFERENTIAL/PLATELET
Basophils Absolute: 78 cells/uL (ref 0–200)
Basophils Relative: 1 %
EOS ABS: 78 {cells}/uL (ref 15–500)
Eosinophils Relative: 1 %
HEMATOCRIT: 41.7 % (ref 35.0–45.0)
Hemoglobin: 13.8 g/dL (ref 11.7–15.5)
Lymphocytes Relative: 27 %
Lymphs Abs: 2106 cells/uL (ref 850–3900)
MCH: 28.9 pg (ref 27.0–33.0)
MCHC: 33.1 g/dL (ref 32.0–36.0)
MCV: 87.4 fL (ref 80.0–100.0)
MPV: 11.6 fL (ref 7.5–12.5)
Monocytes Absolute: 624 cells/uL (ref 200–950)
Monocytes Relative: 8 %
NEUTROS PCT: 63 %
Neutro Abs: 4914 cells/uL (ref 1500–7800)
Platelets: 226 10*3/uL (ref 140–400)
RBC: 4.77 MIL/uL (ref 3.80–5.10)
RDW: 13.2 % (ref 11.0–15.0)
WBC: 7.8 10*3/uL (ref 3.8–10.8)

## 2016-03-08 LAB — LIPID PANEL
Cholesterol: 106 mg/dL — ABNORMAL LOW (ref 125–200)
HDL: 53 mg/dL (ref >=46)
LDL Cholesterol: 22 mg/dL (ref <130)
Total CHOL/HDL Ratio: 2 Ratio (ref <=5.0)
Triglycerides: 153 mg/dL — ABNORMAL HIGH (ref <150)
VLDL: 31 mg/dL — ABNORMAL HIGH (ref <30)

## 2016-03-08 LAB — OVA AND PARASITE EXAMINATION: OP: NONE SEEN

## 2016-03-08 NOTE — Progress Notes (Signed)
    Patricia Owen 1966/02/11 [id number]        50 y.o.  G1P0010  for annual exam.  Several issues noted below.  Past medical history,surgical history, problem list, medications, allergies, family history and social history were all reviewed and documented as reviewed in the EPIC chart.  ROS:  Performed with pertinent positives and negatives included in the history, assessment and plan.   Additional significant findings :  None   Exam: Caryn Bee assistant Filed Vitals:   03/08/16 1356  BP: 120/74  Height: 5\' 4"  (1.626 m)  Weight: 192 lb (87.091 kg)   General appearance:  Normal affect, orientation and appearance. Skin: Grossly normal HEENT: Without gross lesions.  No cervical or supraclavicular adenopathy. Thyroid normal.  Lungs:  Clear without wheezing, rales or rhonchi Cardiac: RR, without RMG Abdominal:  Soft, nontender, without masses, guarding, rebound, organomegaly or hernia Breasts:  Examined lying and sitting without masses, retractions, discharge or axillary adenopathy. Pelvic:  Ext/BUS/Vagina Normal  Cervix normal with IUD string visualized  Uterus anteverted, normal size, shape and contour, midline and mobile nontender   Adnexa without masses or tenderness    Anus and perineum normal   Rectovaginal normal sphincter tone without palpated masses or tenderness.    Assessment/Plan:  50 y.o. G62P0010 female for annual exam without menses, Mirena IUD.   1. Mirena IUD 03/2015. Patient doing great with no menses. IUD string visualized. 2. Pap smear/HPV 11/2013. No Pap smear done today. No history of significant abnormal Pap smears. Plan repeat Pap smear approaching 5 year interval per current screening guidelines. 3. Mammography never. I strongly recommended patient schedule screening mammography. Benefits of early detection reviewed. Patient agrees to schedule. SBE monthly reviewed. 4. Health maintenance. Baseline CBC, CMP, lipid profile, urinalysis ordered. Follow up 1 year,  sooner as needed.   Anastasio Auerbach MD, 2:18 PM 03/08/2016

## 2016-03-08 NOTE — Patient Instructions (Signed)
Schedule your mammogram  You may obtain a copy of any labs that were done today by logging onto MyChart as outlined in the instructions provided with your AVS (after visit summary). The office will not call with normal lab results but certainly if there are any significant abnormalities then we will contact you.   Health Maintenance Adopting a healthy lifestyle and getting preventive care can go a long way to promote health and wellness. Talk with your health care provider about what schedule of regular examinations is right for you. This is a good chance for you to check in with your provider about disease prevention and staying healthy. In between checkups, there are plenty of things you can do on your own. Experts have done a lot of research about which lifestyle changes and preventive measures are most likely to keep you healthy. Ask your health care provider for more information. WEIGHT AND DIET  Eat a healthy diet  Be sure to include plenty of vegetables, fruits, low-fat dairy products, and lean protein.  Do not eat a lot of foods high in solid fats, added sugars, or salt.  Get regular exercise. This is one of the most important things you can do for your health.  Most adults should exercise for at least 150 minutes each week. The exercise should increase your heart rate and make you sweat (moderate-intensity exercise).  Most adults should also do strengthening exercises at least twice a week. This is in addition to the moderate-intensity exercise.  Maintain a healthy weight  Body mass index (BMI) is a measurement that can be used to identify possible weight problems. It estimates body fat based on height and weight. Your health care provider can help determine your BMI and help you achieve or maintain a healthy weight.  For females 20 years of age and older:   A BMI below 18.5 is considered underweight.  A BMI of 18.5 to 24.9 is normal.  A BMI of 25 to 29.9 is considered  overweight.  A BMI of 30 and above is considered obese.  Watch levels of cholesterol and blood lipids  You should start having your blood tested for lipids and cholesterol at 49 years of age, then have this test every 5 years.  You may need to have your cholesterol levels checked more often if:  Your lipid or cholesterol levels are high.  You are older than 50 years of age.  You are at high risk for heart disease.  CANCER SCREENING   Lung Cancer  Lung cancer screening is recommended for adults 55-80 years old who are at high risk for lung cancer because of a history of smoking.  A yearly low-dose CT scan of the lungs is recommended for people who:  Currently smoke.  Have quit within the past 15 years.  Have at least a 30-pack-year history of smoking. A pack year is smoking an average of one pack of cigarettes a day for 1 year.  Yearly screening should continue until it has been 15 years since you quit.  Yearly screening should stop if you develop a health problem that would prevent you from having lung cancer treatment.  Breast Cancer  Practice breast self-awareness. This means understanding how your breasts normally appear and feel.  It also means doing regular breast self-exams. Let your health care provider know about any changes, no matter how small.  If you are in your 20s or 30s, you should have a clinical breast exam (CBE) by a health   care provider every 1-3 years as part of a regular health exam.  If you are 76 or older, have a CBE every year. Also consider having a breast X-ray (mammogram) every year.  If you have a family history of breast cancer, talk to your health care provider about genetic screening.  If you are at high risk for breast cancer, talk to your health care provider about having an MRI and a mammogram every year.  Breast cancer gene (BRCA) assessment is recommended for women who have family members with BRCA-related cancers. BRCA-related  cancers include:  Breast.  Ovarian.  Tubal.  Peritoneal cancers.  Results of the assessment will determine the need for genetic counseling and BRCA1 and BRCA2 testing. Cervical Cancer Routine pelvic examinations to screen for cervical cancer are no longer recommended for nonpregnant women who are considered low risk for cancer of the pelvic organs (ovaries, uterus, and vagina) and who do not have symptoms. A pelvic examination may be necessary if you have symptoms including those associated with pelvic infections. Ask your health care provider if a screening pelvic exam is right for you.   The Pap test is the screening test for cervical cancer for women who are considered at risk.  If you had a hysterectomy for a problem that was not cancer or a condition that could lead to cancer, then you no longer need Pap tests.  If you are older than 65 years, and you have had normal Pap tests for the past 10 years, you no longer need to have Pap tests.  If you have had past treatment for cervical cancer or a condition that could lead to cancer, you need Pap tests and screening for cancer for at least 20 years after your treatment.  If you no longer get a Pap test, assess your risk factors if they change (such as having a new sexual partner). This can affect whether you should start being screened again.  Some women have medical problems that increase their chance of getting cervical cancer. If this is the case for you, your health care provider may recommend more frequent screening and Pap tests.  The human papillomavirus (HPV) test is another test that may be used for cervical cancer screening. The HPV test looks for the virus that can cause cell changes in the cervix. The cells collected during the Pap test can be tested for HPV.  The HPV test can be used to screen women 64 years of age and older. Getting tested for HPV can extend the interval between normal Pap tests from three to five  years.  An HPV test also should be used to screen women of any age who have unclear Pap test results.  After 49 years of age, women should have HPV testing as often as Pap tests.  Colorectal Cancer  This type of cancer can be detected and often prevented.  Routine colorectal cancer screening usually begins at 49 years of age and continues through 49 years of age.  Your health care provider may recommend screening at an earlier age if you have risk factors for colon cancer.  Your health care provider may also recommend using home test kits to check for hidden blood in the stool.  A small camera at the end of a tube can be used to examine your colon directly (sigmoidoscopy or colonoscopy). This is done to check for the earliest forms of colorectal cancer.  Routine screening usually begins at age 14.  Direct examination of the  colon should be repeated every 5-10 years through 49 years of age. However, you may need to be screened more often if early forms of precancerous polyps or small growths are found. Skin Cancer  Check your skin from head to toe regularly.  Tell your health care provider about any new moles or changes in moles, especially if there is a change in a mole's shape or color.  Also tell your health care provider if you have a mole that is larger than the size of a pencil eraser.  Always use sunscreen. Apply sunscreen liberally and repeatedly throughout the day.  Protect yourself by wearing long sleeves, pants, a wide-brimmed hat, and sunglasses whenever you are outside. HEART DISEASE, DIABETES, AND HIGH BLOOD PRESSURE   Have your blood pressure checked at least every 1-2 years. High blood pressure causes heart disease and increases the risk of stroke.  If you are between 57 years and 49 years old, ask your health care provider if you should take aspirin to prevent strokes.  Have regular diabetes screenings. This involves taking a blood sample to check your fasting  blood sugar level.  If you are at a normal weight and have a low risk for diabetes, have this test once every three years after 49 years of age.  If you are overweight and have a high risk for diabetes, consider being tested at a younger age or more often. PREVENTING INFECTION  Hepatitis B  If you have a higher risk for hepatitis B, you should be screened for this virus. You are considered at high risk for hepatitis B if:  You were born in a country where hepatitis B is common. Ask your health care provider which countries are considered high risk.  Your parents were born in a high-risk country, and you have not been immunized against hepatitis B (hepatitis B vaccine).  You have HIV or AIDS.  You use needles to inject street drugs.  You live with someone who has hepatitis B.  You have had sex with someone who has hepatitis B.  You get hemodialysis treatment.  You take certain medicines for conditions, including cancer, organ transplantation, and autoimmune conditions. Hepatitis C  Blood testing is recommended for:  Everyone born from [birth year] through 1964.  Anyone with known risk factors for hepatitis C. Sexually transmitted infections (STIs)  You should be screened for sexually transmitted infections (STIs) including gonorrhea and chlamydia if:  You are sexually active and are younger than 49 years of age.  You are older than 49 years of age and your health care provider tells you that you are at risk for this type of infection.  Your sexual activity has changed since you were last screened and you are at an increased risk for chlamydia or gonorrhea. Ask your health care provider if you are at risk.  If you do not have HIV, but are at risk, it may be recommended that you take a prescription medicine daily to prevent HIV infection. This is called pre-exposure prophylaxis (PrEP). You are considered at risk if:  You are sexually active and do not regularly use condoms or know  the HIV status of your partner(s).  You take drugs by injection.  You are sexually active with a partner who has HIV. Talk with your health care provider about whether you are at high risk of being infected with HIV. If you choose to begin PrEP, you should first be tested for HIV. You should then be tested every 3 months  for as long as you are taking PrEP.  PREGNANCY   If you are premenopausal and you may become pregnant, ask your health care provider about preconception counseling.  If you may become pregnant, take 400 to 800 micrograms (mcg) of folic acid every day.  If you want to prevent pregnancy, talk to your health care provider about birth control (contraception). OSTEOPOROSIS AND MENOPAUSE   Osteoporosis is a disease in which the bones lose minerals and strength with aging. This can result in serious bone fractures. Your risk for osteoporosis can be identified using a bone density scan.  If you are 65 years of age or older, or if you are at risk for osteoporosis and fractures, ask your health care provider if you should be screened.  Ask your health care provider whether you should take a calcium or vitamin D supplement to lower your risk for osteoporosis.  Menopause may have certain physical symptoms and risks.  Hormone replacement therapy may reduce some of these symptoms and risks. Talk to your health care provider about whether hormone replacement therapy is right for you.  HOME CARE INSTRUCTIONS   Schedule regular health, dental, and eye exams.  Stay current with your immunizations.   Do not use any tobacco products including cigarettes, chewing tobacco, or electronic cigarettes.  If you are pregnant, do not drink alcohol.  If you are breastfeeding, limit how much and how often you drink alcohol.  Limit alcohol intake to no more than 1 drink per day for nonpregnant women. One drink equals 12 ounces of beer, 5 ounces of wine, or 1 ounces of hard liquor.  Do not  use street drugs.  Do not share needles.  Ask your health care provider for help if you need support or information about quitting drugs.  Tell your health care provider if you often feel depressed.  Tell your health care provider if you have ever been abused or do not feel safe at home. Document Released: 02/28/2011 Document Revised: 12/30/2013 Document Reviewed: 07/17/2013 Exit[employer] Patient Information 2015 [employer]. This information is not intended to replace advice given to you by your health care provider. Make sure you discuss any questions you have with your health care provider.  

## 2016-03-09 ENCOUNTER — Encounter: Payer: Self-pay | Admitting: Gastroenterology

## 2016-03-09 LAB — URINALYSIS W MICROSCOPIC + REFLEX CULTURE
Bacteria, UA: NONE SEEN [HPF]
Bilirubin Urine: NEGATIVE
Casts: NONE SEEN [LPF]
Crystals: NONE SEEN [HPF]
GLUCOSE, UA: NEGATIVE
Hgb urine dipstick: NEGATIVE
Ketones, ur: NEGATIVE
LEUKOCYTES UA: NEGATIVE
NITRITE: NEGATIVE
PH: 6 (ref 5.0–8.0)
Protein, ur: NEGATIVE
RBC / HPF: NONE SEEN RBC/HPF (ref <=2)
SPECIFIC GRAVITY, URINE: 1.007 (ref 1.001–1.035)
Squamous Epithelial / LPF: NONE SEEN [HPF] (ref <=5)
WBC, UA: NONE SEEN WBC/HPF (ref <=5)
YEAST: NONE SEEN [HPF]

## 2016-03-10 ENCOUNTER — Telehealth: Payer: Self-pay

## 2016-03-10 ENCOUNTER — Telehealth: Payer: Self-pay | Admitting: Gastroenterology

## 2016-03-10 ENCOUNTER — Other Ambulatory Visit: Payer: Self-pay | Admitting: Gynecology

## 2016-03-10 DIAGNOSIS — R7989 Other specified abnormal findings of blood chemistry: Secondary | ICD-10-CM

## 2016-03-10 DIAGNOSIS — R945 Abnormal results of liver function studies: Principal | ICD-10-CM

## 2016-03-10 NOTE — Telephone Encounter (Signed)
I did not check the stool for blood because I have no reason to suspect that she has been bleeding. I am not planning an upper endoscopy.

## 2016-03-10 NOTE — Telephone Encounter (Signed)
Pt notified and will call with any changes

## 2016-03-10 NOTE — Telephone Encounter (Signed)
Pt is asking if the stool study she did (o&p) tested positive for blood, and if that validates having an EGD. I know they didn't do a fecal blood test. Would you like to order EGD or additional stool studies for pt? Please advise.

## 2016-03-10 NOTE — Telephone Encounter (Signed)
Informed Patricia Owen that the office would be back in touch with her next week after Dr Loletha Carrow was back in the clinic and i would contact her back on Tuesday.

## 2016-03-11 NOTE — Telephone Encounter (Signed)
Pt has been notified and aware. She is going to look into going to a different gastroenterology dept (one that has more experience with parasites) maybe a tertiary care facility.Pt was offered a follow up office visit as she was complaining of bloating after meals and heartburn, she declined the visit at this time.

## 2016-04-07 ENCOUNTER — Other Ambulatory Visit: Payer: Self-pay

## 2016-04-20 ENCOUNTER — Other Ambulatory Visit: Payer: BLUE CROSS/BLUE SHIELD

## 2016-04-20 DIAGNOSIS — R945 Abnormal results of liver function studies: Principal | ICD-10-CM

## 2016-04-20 DIAGNOSIS — R7989 Other specified abnormal findings of blood chemistry: Secondary | ICD-10-CM

## 2016-04-20 LAB — COMPREHENSIVE METABOLIC PANEL
ALT: 50 U/L — ABNORMAL HIGH (ref 6–29)
AST: 29 U/L (ref 10–35)
Albumin: 3.9 g/dL (ref 3.6–5.1)
Alkaline Phosphatase: 71 U/L (ref 33–115)
BILIRUBIN TOTAL: 0.6 mg/dL (ref 0.2–1.2)
BUN: 14 mg/dL (ref 7–25)
CALCIUM: 9.3 mg/dL (ref 8.6–10.2)
CHLORIDE: 105 mmol/L (ref 98–110)
CO2: 25 mmol/L (ref 20–31)
Creat: 0.8 mg/dL (ref 0.50–1.10)
GLUCOSE: 97 mg/dL (ref 65–99)
POTASSIUM: 4.3 mmol/L (ref 3.5–5.3)
Sodium: 139 mmol/L (ref 135–146)
Total Protein: 6.6 g/dL (ref 6.1–8.1)

## 2016-05-13 ENCOUNTER — Encounter: Payer: Self-pay | Admitting: Gynecology

## 2016-05-20 ENCOUNTER — Encounter: Payer: Self-pay | Admitting: Gynecology

## 2016-10-14 ENCOUNTER — Telehealth: Payer: Self-pay | Admitting: *Deleted

## 2016-10-14 DIAGNOSIS — R109 Unspecified abdominal pain: Secondary | ICD-10-CM

## 2016-10-14 DIAGNOSIS — K219 Gastro-esophageal reflux disease without esophagitis: Secondary | ICD-10-CM

## 2016-10-14 NOTE — Telephone Encounter (Signed)
Pt called was seen at [facility] last week due to flu like symptoms , pt mentioned to MD that she had some upper abdomen discomfort and mid back discomfort as well, and increased weight gain. Pt said she weights 202 pounds now has terrible acid reflux, eating healthy exercising etc. She asked ifTSH level should be checked, doesn't have PCP. Pt would like any recommendations how to go about the above. Please advise

## 2016-10-14 NOTE — Telephone Encounter (Signed)
Left message for pt to call.

## 2016-10-14 NOTE — Telephone Encounter (Signed)
#  1 we can check TSH year diagnosis abnormal weight gain #2 would recommend appoint with gastroenterologist for evaluation of her reflux and abdominal pain

## 2016-10-19 NOTE — Telephone Encounter (Signed)
Left message for pt to call.

## 2016-10-20 ENCOUNTER — Encounter: Payer: Self-pay | Admitting: Family Medicine

## 2016-10-20 ENCOUNTER — Ambulatory Visit (INDEPENDENT_AMBULATORY_CARE_PROVIDER_SITE_OTHER): Payer: BLUE CROSS/BLUE SHIELD | Admitting: Family Medicine

## 2016-10-20 VITALS — BP 124/90 | HR 88 | Temp 98.2°F | Ht 64.0 in | Wt 197.0 lb

## 2016-10-20 DIAGNOSIS — K219 Gastro-esophageal reflux disease without esophagitis: Secondary | ICD-10-CM | POA: Diagnosis not present

## 2016-10-20 DIAGNOSIS — R1013 Epigastric pain: Secondary | ICD-10-CM | POA: Diagnosis not present

## 2016-10-20 DIAGNOSIS — E669 Obesity, unspecified: Secondary | ICD-10-CM

## 2016-10-20 DIAGNOSIS — R7401 Elevation of levels of liver transaminase levels: Secondary | ICD-10-CM

## 2016-10-20 DIAGNOSIS — K760 Fatty (change of) liver, not elsewhere classified: Secondary | ICD-10-CM | POA: Insufficient documentation

## 2016-10-20 DIAGNOSIS — R05 Cough: Secondary | ICD-10-CM | POA: Diagnosis not present

## 2016-10-20 DIAGNOSIS — N809 Endometriosis, unspecified: Secondary | ICD-10-CM

## 2016-10-20 DIAGNOSIS — R740 Nonspecific elevation of levels of transaminase and lactic acid dehydrogenase [LDH]: Secondary | ICD-10-CM

## 2016-10-20 DIAGNOSIS — R053 Chronic cough: Secondary | ICD-10-CM

## 2016-10-20 DIAGNOSIS — Z23 Encounter for immunization: Secondary | ICD-10-CM | POA: Diagnosis not present

## 2016-10-20 MED ORDER — ESOMEPRAZOLE MAGNESIUM 40 MG PO CPDR: 40.0000 mg | DELAYED_RELEASE_CAPSULE | Freq: Every day | ORAL | 1 refills | Status: DC

## 2016-10-20 MED ORDER — RANITIDINE HCL 150 MG PO TABS: 150.0000 mg | ORAL_TABLET | Freq: Two times a day (BID) | ORAL | Status: DC

## 2016-10-20 NOTE — Assessment & Plan Note (Signed)
Mild. Anticipate fatty liver related. Update LFTs today.

## 2016-10-20 NOTE — Progress Notes (Signed)
Pre visit review using our clinic review tool, if applicable. No additional management support is needed unless otherwise documented below in the visit note. 

## 2016-10-20 NOTE — Assessment & Plan Note (Addendum)
Discussed etiology of GERD and management options - see pt instructions. Pt not interested in longterm PPI. Will treat with 3 wk nexium course, zantac nightly, further dietary modifications. Update with effect, consider GI referral.

## 2016-10-20 NOTE — Assessment & Plan Note (Signed)
Anticipate GERD related. Not consistent with gallstones. See above for treatment plan. Update if not resolved for GI referral to consider EGD to eval HH, barretts, other cause of intermittent regurgitation.  With boring epigastric pain, check lipase.

## 2016-10-20 NOTE — Patient Instructions (Addendum)
Flu shot today. Tdap today.  Labs today. For reflux -  Start nexium 40mg  daily for 3 weeks then as needed. Take zantac over the counter nightly for the same time frame. If no better with this, let us know for referral to GI.  Head of bed elevated.  Avoid of citrus, fatty foods, chocolate, peppermint, and excessive alcohol, along with sodas, orange juice (acidic drinks) At least a few hours between dinner and bed, minimize naps after eating.  Restart flonase daily.  Good to meet you today, call us with questions. Return as needed or in 1 year for physical.

## 2016-10-20 NOTE — Assessment & Plan Note (Signed)
Discussed healthy diet and lifestyle changes to affect sustainable weight loss. Discussed bariatric medication - pt doesn't feel appetite is an issue.

## 2016-10-20 NOTE — Assessment & Plan Note (Signed)
Discussed GERD vs allergies related. rec PPI, flonase daily for 3 wks

## 2016-10-20 NOTE — Progress Notes (Signed)
BP 124/90   Pulse 88   Temp 98.2 F (36.8 C) (Oral)   Ht '5\' 4"'  (1.626 m)   Wt 197 lb (89.4 kg)   BMI 33.81 kg/m    CC: new pt to establish care Subjective:    Patient ID: Patricia Owen, female    DOB: [birth year], 51 y.o.   MRN: [id number]  HPI: Patricia Owen is a 51 y.o. female presenting on 10/20/2016 for Establish Care   Seen last month at [facility] with URI - this has largely resolved.   H/o sinus surgery 2015. Ongoing cough since then, initially dry then productive of clear mucous, worse in am and pm and worse with meals. Some GERD, some epigastric burning, some regurgitation. Treated with apple cider vinegar currently, previously omeprazole daily for 2 yrs. Worried about prolonged PPI use causing osteoporosis. Some boring pain to the back.   Weight gain. No fevers/chills, dysphagia, ongoing vomiting or nausea. No blood in emesis.   Endometriosis - followed by GYN. Very stable period.  LMP 04/2015 after mirena placed 03/2015.   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 Interim medical history since our last visit reviewed. Allergies and medications reviewed and updated. Outpatient Medications Prior to Visit  Medication Sig Dispense Refill  . APPLE CIDER VINEGAR PO Take by mouth.    . Ascorbic Acid (VITAMIN C PO) Take by mouth.    . Cholecalciferol (VITAMIN D PO) Take by mouth.    . fluticasone (FLONASE) 50 MCG/ACT nasal spray Place 2 sprays into both nostrils daily. (Patient taking differently: Place 2 sprays into both nostrils daily as needed. ) 16 g 2  . Nutritional Supplements (ESTROVEN PO) Take by mouth.    . NON FORMULARY  Tumeric     Facility-Administered Medications Prior to Visit  Medication Dose Route Frequency Provider Last Rate Last Dose  . levonorgestrel (MIRENA) 20 MCG/24HR IUD   Intrauterine Once Anastasio Auerbach, MD         Per HPI unless specifically indicated in ROS section below Review of Systems     Objective:    BP 124/90   Pulse 88   Temp 98.2 F (36.8 C) (Oral)   Ht '5\' 4"'  (1.626 m)   Wt 197 lb (89.4 kg)   BMI 33.81 kg/m   Wt Readings from Last 3 Encounters:  10/20/16 197 lb (89.4 kg)  03/08/16 192 lb (87.1 kg)  03/04/16 192 lb 3.2 oz (87.2 kg)    Physical Exam  Constitutional: She appears well-developed and well-nourished. No distress.  HENT:  Head: Normocephalic and atraumatic.  Mouth/Throat: Oropharynx is clear and moist. No oropharyngeal exudate.  Eyes: Conjunctivae and EOM are normal. Pupils are equal, round, and reactive to light. No scleral icterus.  Neck: Normal range of motion. Neck supple. Carotid bruit is not present.  Cardiovascular: Normal rate, regular rhythm, normal heart sounds and intact distal pulses.   No murmur heard. Pulmonary/Chest: Effort normal and breath sounds normal. No respiratory distress. She has no wheezes. She has no rales.  Abdominal: Soft. Normal appearance and bowel sounds are normal. She exhibits no distension and no mass. There is no hepatosplenomegaly. There is tenderness (mild) in the epigastric area. There is no rigidity,  no rebound, no guarding, no CVA tenderness and negative Murphy's sign.  Musculoskeletal: She exhibits no edema.  Lymphadenopathy:    She has no cervical adenopathy.  Skin: Skin is warm and dry. No rash noted.  Psychiatric: She has a normal mood and affect.  Nursing note and vitals reviewed.  Results for orders placed or performed in visit on 04/20/16  Comp Met (CMET)  Result Value Ref Range   Sodium 139 135 - 146 mmol/L   Potassium 4.3 3.5 - 5.3 mmol/L   Chloride 105 98 - 110 mmol/L   CO2 25 20 - 31 mmol/L    Glucose, Bld 97 65 - 99 mg/dL   BUN 14 7 - 25 mg/dL   Creat 0.80 0.50 - 1.10 mg/dL   Total Bilirubin 0.6 0.2 - 1.2 mg/dL   Alkaline Phosphatase 71 33 - 115 U/L   AST 29 10 - 35 U/L   ALT 50 (H) 6 - 29 U/L   Total Protein 6.6 6.1 - 8.1 g/dL   Albumin 3.9 3.6 - 5.1 g/dL   Calcium 9.3 8.6 - 10.2 mg/dL      Assessment & Plan:   Problem List Items Addressed This Visit    Chronic cough    Discussed GERD vs allergies related. rec PPI, flonase daily for 3 wks      Endometriosis   Epigastric discomfort    Anticipate GERD related. Not consistent with gallstones. See above for treatment plan. Update if not resolved for GI referral to consider EGD to eval HH, barretts, other cause of intermittent regurgitation.  With boring epigastric pain, check lipase.       Relevant Orders   Hepatic function panel   TSH   Lipase   Gastroesophageal reflux disease - Primary    Discussed etiology of GERD and management options - see pt instructions. Pt not interested in longterm PPI. Will treat with 3 wk nexium course, zantac nightly, further dietary modifications. Update with effect, consider GI referral.       Relevant Medications   Probiotic Product (PROBIOTIC DAILY PO)   esomeprazole (NEXIUM) 40 MG capsule   ranitidine (ZANTAC) 150 MG tablet   Obesity, Class I, BMI 30-34.9    Discussed healthy diet and lifestyle changes to affect sustainable weight loss. Discussed bariatric medication - pt doesn't feel appetite is an issue.       Transaminitis    Mild. Anticipate fatty liver related. Update LFTs today.          Follow up plan: Return in about 1 year (around 10/20/2017) for annual exam, prior fasting for blood work.  Patricia Bush, MD

## 2016-10-21 LAB — HEPATIC FUNCTION PANEL
ALK PHOS: 77 U/L (ref 39–117)
ALT: 34 U/L (ref 0–35)
AST: 24 U/L (ref 0–37)
Albumin: 4.3 g/dL (ref 3.5–5.2)
BILIRUBIN DIRECT: 0.1 mg/dL (ref 0.0–0.3)
TOTAL PROTEIN: 7.1 g/dL (ref 6.0–8.3)
Total Bilirubin: 0.3 mg/dL (ref 0.2–1.2)

## 2016-10-21 LAB — TSH: TSH: 0.98 u[IU]/mL (ref 0.35–4.50)

## 2016-10-21 LAB — LIPASE: Lipase: 37 U/L (ref 11.0–59.0)

## 2016-12-19 ENCOUNTER — Other Ambulatory Visit: Payer: Self-pay | Admitting: Family Medicine

## 2017-03-09 ENCOUNTER — Encounter: Payer: BLUE CROSS/BLUE SHIELD | Admitting: Gynecology

## 2017-04-17 ENCOUNTER — Ambulatory Visit (INDEPENDENT_AMBULATORY_CARE_PROVIDER_SITE_OTHER): Payer: BLUE CROSS/BLUE SHIELD | Admitting: Family Medicine

## 2017-04-17 ENCOUNTER — Encounter: Payer: Self-pay | Admitting: Family Medicine

## 2017-04-17 VITALS — BP 126/68 | HR 82 | Temp 98.0°F | Wt 197.2 lb

## 2017-04-17 DIAGNOSIS — K219 Gastro-esophageal reflux disease without esophagitis: Secondary | ICD-10-CM

## 2017-04-17 DIAGNOSIS — R053 Chronic cough: Secondary | ICD-10-CM

## 2017-04-17 DIAGNOSIS — R1013 Epigastric pain: Secondary | ICD-10-CM

## 2017-04-17 DIAGNOSIS — R05 Cough: Secondary | ICD-10-CM | POA: Diagnosis not present

## 2017-04-17 NOTE — Progress Notes (Signed)
BP 126/68   Pulse 82   Temp 98 F (36.7 C) (Oral)   Wt 197 lb 4 oz (89.5 kg)   SpO2 96%   BMI 33.86 kg/m    CC: f/u GERD Subjective:    Patient ID: Patricia Owen, female    DOB: 1966-06-01, 51 y.o.   MRN: [id number]  HPI: Patricia Owen is a 51 y.o. female presenting on 04/17/2017 for Follow-up (acid reflux)   See prior note for details. Established care 09/2016 with ongoing chronic cough previously attributed to chronic sinus issues s/p sinus surgery with no improvement, then attributed to GERD so we restarted daily nexium x3 wks with nightly zantac (as well as flonase). omeprazole previously was not effective. She did not want to be on longterm PPI therapy. She also had chronic epigastric pain with intermittent regurgitation and we had discussed GI referral if not improved with PPI therapy. 3 wk nexium/zantac did help cough and she had no vomiting episodes. She also stopped caffeine, citruses, tomatoes, spicy foods. She has switched to honey instead of sugar.   She stopped PPI/zantac therapy and has had recurrence of chronic cough with post tussive emesis up to every other day. Ongoing boring epigastric pain to back that is not positional.   Ongoing bilateral arm paresthesias.  Ongoing chronic cough present for years, reviewed CXR in system from 2015.   Relevant past medical, surgical, family and social history reviewed and updated as indicated. Interim medical history since our last visit reviewed. Allergies and medications reviewed and updated. Outpatient Medications Prior to Visit  Medication Sig Dispense Refill  . Nutritional Supplements (ESTROVEN PO) Take by mouth.    . esomeprazole (NEXIUM) 40 MG capsule TAKE 1 CAPSULE (40 MG TOTAL) BY MOUTH DAILY FOR 3 WEEKS THEN AS NEEDED 30 capsule 3  . ranitidine (ZANTAC) 150 MG tablet Take 1 tablet (150 mg total) by mouth 2 (two) times daily.    . APPLE CIDER VINEGAR PO Take by mouth.    . Ascorbic Acid (VITAMIN C PO) Take by mouth.    .  Cholecalciferol (VITAMIN D PO) Take by mouth.    . fluticasone (FLONASE) 50 MCG/ACT nasal spray Place 2 sprays into both nostrils daily. (Patient not taking: Reported on 04/17/2017) 16 g 2  . Probiotic Product (PROBIOTIC DAILY PO) Take 1 tablet by mouth daily.    . Turmeric 500 MG CAPS Take by mouth.    . vitamin B-12 (CYANOCOBALAMIN) 1000 MCG tablet Take 1,000 mcg by mouth daily.     Facility-Administered Medications Prior to Visit  Medication Dose Route Frequency Provider Last Rate Last Dose  . levonorgestrel (MIRENA) 20 MCG/24HR IUD   Intrauterine Once [name], [name], MD         Per HPI unless specifically indicated in ROS section below Review of Systems     Objective:    BP 126/68   Pulse 82   Temp 98 F (36.7 C) (Oral)   Wt 197 lb 4 oz (89.5 kg)   SpO2 96%   BMI 33.86 kg/m   Wt Readings from Last 3 Encounters:  04/17/17 197 lb 4 oz (89.5 kg)  10/20/16 197 lb (89.4 kg)  03/08/16 192 lb (87.1 kg)    Physical Exam  Constitutional: She appears well-developed and well-nourished. No distress.  HENT:  Head: Normocephalic and atraumatic.  Mouth/Throat: Oropharynx is clear and moist. No oropharyngeal exudate.  Eyes: Pupils are equal, round, and reactive to light. Conjunctivae and EOM are normal. No scleral icterus.  Neck: Normal range of motion. Neck supple.  Cardiovascular: Normal rate, regular rhythm, normal heart sounds and intact distal pulses.   No murmur heard. Pulmonary/Chest: Effort normal and breath sounds normal. No respiratory distress. She has no wheezes. She has no rales.  Nagging cough present  Abdominal: Soft. Normal appearance and bowel sounds are normal. She exhibits no distension and no mass. There is no hepatosplenomegaly. There is tenderness (moderate) in the epigastric area. There is no rigidity, no rebound, no guarding, no CVA tenderness and negative Murphy's sign.  Musculoskeletal: She exhibits no edema.  Psychiatric: She has a normal mood and affect.   Nursing note and vitals reviewed.  Results for orders placed or performed in visit on 10/20/16  Hepatic function panel  Result Value Ref Range   Total Bilirubin 0.3 0.2 - 1.2 mg/dL   Bilirubin, Direct 0.1 0.0 - 0.3 mg/dL   Alkaline Phosphatase 77 39 - 117 U/L   AST 24 0 - 37 U/L   ALT 34 0 - 35 U/L   Total Protein 7.1 6.0 - 8.3 g/dL   Albumin 4.3 3.5 - 5.2 g/dL  TSH  Result Value Ref Range   TSH 0.98 0.35 - 4.50 uIU/mL  Lipase  Result Value Ref Range   Lipase 37.0 11.0 - 59.0 U/L      Assessment & Plan:   Problem List Items Addressed This Visit    Chronic cough    Ongoing - likely GERD related as it did improve when on daily nexium.       Relevant Orders   Ambulatory referral to Gastroenterology   Epigastric discomfort - Primary    Ongoing epigastric pain with boring pain to mid back, lipase last labwork was normal. ?PUD vs gastritis - will check for H pylori. Discussed restarting PPI and zantac (see above). Will refer to GI to further discuss possible EGD r/o barrett's, zenker's.      Relevant Orders   Helicobacter pylori special antigen   Ambulatory referral to Gastroenterology   Gastroesophageal reflux disease    Discussed may restart zantac and if not controlling sxs to restart daily nexium while she sees GI.       Relevant Orders   Helicobacter pylori special antigen   Ambulatory referral to Gastroenterology       Follow up plan: Return if symptoms worsen or fail to improve.  Ria Bush, MD

## 2017-04-17 NOTE — Assessment & Plan Note (Signed)
Discussed may restart zantac and if not controlling sxs to restart daily nexium while she sees GI.

## 2017-04-17 NOTE — Assessment & Plan Note (Addendum)
Ongoing epigastric pain with boring pain to mid back, lipase last labwork was normal. ?PUD vs gastritis - will check for H pylori. Discussed restarting PPI and zantac (see above). Will refer to GI to further discuss possible EGD r/o barrett's, zenker's.

## 2017-04-17 NOTE — Assessment & Plan Note (Signed)
Ongoing - likely GERD related as it did improve when on daily nexium.

## 2017-04-17 NOTE — Patient Instructions (Addendum)
Pass by lab to pick up H pylori stool test See Rosaria Ferries on your way out to schedule GI appointment If prolonged wait, start zantac nightly and if no improvement, restart nexium 40mg  daily.  Update me with how you're doing.

## 2017-04-17 NOTE — Addendum Note (Signed)
Addended by: Ellamae Sia on: 04/17/2017 05:04 PM   Modules accepted: Orders

## 2017-04-19 ENCOUNTER — Ambulatory Visit (INDEPENDENT_AMBULATORY_CARE_PROVIDER_SITE_OTHER): Payer: BLUE CROSS/BLUE SHIELD | Admitting: Gynecology

## 2017-04-19 ENCOUNTER — Encounter: Payer: Self-pay | Admitting: Gynecology

## 2017-04-19 VITALS — BP 120/76 | Ht 64.5 in | Wt 196.0 lb

## 2017-04-19 DIAGNOSIS — Z30431 Encounter for routine checking of intrauterine contraceptive device: Secondary | ICD-10-CM | POA: Diagnosis not present

## 2017-04-19 DIAGNOSIS — Z13220 Encounter for screening for lipoid disorders: Secondary | ICD-10-CM | POA: Diagnosis not present

## 2017-04-19 DIAGNOSIS — Z01419 Encounter for gynecological examination (general) (routine) without abnormal findings: Secondary | ICD-10-CM

## 2017-04-19 LAB — CBC WITH DIFFERENTIAL/PLATELET
BASOS PCT: 1 %
Basophils Absolute: 76 cells/uL (ref 0–200)
EOS ABS: 0 {cells}/uL — AB (ref 15–500)
EOS PCT: 0 %
HCT: 41.3 % (ref 35.0–45.0)
Hemoglobin: 13.7 g/dL (ref 11.7–15.5)
Lymphocytes Relative: 33 %
Lymphs Abs: 2508 cells/uL (ref 850–3900)
MCH: 29.3 pg (ref 27.0–33.0)
MCHC: 33.2 g/dL (ref 32.0–36.0)
MCV: 88.2 fL (ref 80.0–100.0)
MONOS PCT: 10 %
MPV: 10.9 fL (ref 7.5–12.5)
Monocytes Absolute: 760 cells/uL (ref 200–950)
NEUTROS ABS: 4256 {cells}/uL (ref 1500–7800)
Neutrophils Relative %: 56 %
PLATELETS: 239 10*3/uL (ref 140–400)
RBC: 4.68 MIL/uL (ref 3.80–5.10)
RDW: 13.3 % (ref 11.0–15.0)
WBC: 7.6 10*3/uL (ref 3.8–10.8)

## 2017-04-19 NOTE — Progress Notes (Signed)
    Patricia Owen [birth year] [id number]        51 y.o.  G1P0010 for annual exam.    Past medical history,surgical history, problem list, medications, allergies, family history and social history were all reviewed and documented as reviewed in the EPIC chart.  ROS:  Performed with pertinent positives and negatives included in the history, assessment and plan.   Additional significant findings :  None   Exam: Patricia Owen assistant Vitals:   04/19/17 1127  BP: 120/76  Weight: 196 lb (88.9 kg)  Height: 5' 4.5" (1.638 m)   Body mass index is 33.12 kg/m.  General appearance:  Normal affect, orientation and appearance. Skin: Grossly normal HEENT: Without gross lesions.  No cervical or supraclavicular adenopathy. Thyroid normal.  Lungs:  Clear without wheezing, rales or rhonchi Cardiac: RR, without RMG Abdominal:  Soft, nontender, without masses, guarding, rebound, organomegaly or hernia Breasts:  Examined lying and sitting without masses, retractions, discharge or axillary adenopathy. Pelvic:  Ext, BUS, Vagina: Normal  Cervix: Normal with IUD string visualized  Uterus: Anteverted, normal size, shape and contour, midline and mobile nontender   Adnexa: Without masses or tenderness    Anus and perineum: Normal   Rectovaginal: Normal sphincter tone without palpated masses or tenderness.    Assessment/Plan:  51 y.o. G33P0010 female for annual exam without menses, Mirena IUD.   1. Mirena IUD 03/2015. Doing well without menses. IUD string visualized. 2. Pap smear/HPV 2015. No Pap smear done today. No history of abnormal Pap smears. Plan repeat Pap smear at 5 year interval per current screening guidelines. 3. Mammography 04/2016. Continue with annual mammography this fall. Breast exam normal today. 4. Health maintenance. Baseline CBC, CMP and lipid profile ordered. Follow up 1 year, sooner as needed.   Anastasio Auerbach MD, 11:54 AM 04/19/2017

## 2017-04-19 NOTE — Patient Instructions (Signed)
Follow up in one year for annual exam 

## 2017-04-20 LAB — COMPREHENSIVE METABOLIC PANEL
ALT: 32 U/L — AB (ref 6–29)
AST: 21 U/L (ref 10–35)
Albumin: 4.5 g/dL (ref 3.6–5.1)
Alkaline Phosphatase: 76 U/L (ref 33–115)
BUN: 12 mg/dL (ref 7–25)
CO2: 22 mmol/L (ref 20–32)
Calcium: 9.8 mg/dL (ref 8.6–10.2)
Chloride: 104 mmol/L (ref 98–110)
Creat: 0.75 mg/dL (ref 0.50–1.10)
Glucose, Bld: 86 mg/dL (ref 65–99)
Potassium: 4.6 mmol/L (ref 3.5–5.3)
SODIUM: 139 mmol/L (ref 135–146)
TOTAL PROTEIN: 7.1 g/dL (ref 6.1–8.1)
Total Bilirubin: 0.6 mg/dL (ref 0.2–1.2)

## 2017-04-20 LAB — LIPID PANEL
CHOL/HDL RATIO: 1.9 ratio (ref <5.0)
CHOLESTEROL: 104 mg/dL (ref <200)
HDL: 56 mg/dL (ref >50)
LDL Cholesterol: 39 mg/dL (ref <100)
Triglycerides: 46 mg/dL (ref <150)
VLDL: 9 mg/dL (ref <30)

## 2017-04-20 NOTE — Addendum Note (Signed)
Addended by: Ellamae Sia on: 04/20/2017 10:09 AM   Modules accepted: Orders

## 2017-04-21 LAB — HELICOBACTER PYLORI  SPECIAL ANTIGEN: H. PYLORI Antigen: NOT DETECTED

## 2017-05-26 ENCOUNTER — Ambulatory Visit (INDEPENDENT_AMBULATORY_CARE_PROVIDER_SITE_OTHER): Payer: BLUE CROSS/BLUE SHIELD | Admitting: Physician Assistant

## 2017-05-26 ENCOUNTER — Ambulatory Visit: Payer: BLUE CROSS/BLUE SHIELD | Admitting: Gastroenterology

## 2017-05-26 ENCOUNTER — Other Ambulatory Visit (INDEPENDENT_AMBULATORY_CARE_PROVIDER_SITE_OTHER): Payer: BLUE CROSS/BLUE SHIELD

## 2017-05-26 ENCOUNTER — Encounter: Payer: Self-pay | Admitting: Physician Assistant

## 2017-05-26 VITALS — BP 118/78 | HR 70 | Ht 64.0 in | Wt 195.0 lb

## 2017-05-26 DIAGNOSIS — R1110 Vomiting, unspecified: Secondary | ICD-10-CM

## 2017-05-26 DIAGNOSIS — K5900 Constipation, unspecified: Secondary | ICD-10-CM

## 2017-05-26 DIAGNOSIS — R05 Cough: Secondary | ICD-10-CM | POA: Diagnosis not present

## 2017-05-26 DIAGNOSIS — R1013 Epigastric pain: Secondary | ICD-10-CM

## 2017-05-26 DIAGNOSIS — Z1212 Encounter for screening for malignant neoplasm of rectum: Secondary | ICD-10-CM

## 2017-05-26 DIAGNOSIS — R059 Cough, unspecified: Secondary | ICD-10-CM

## 2017-05-26 DIAGNOSIS — Z1211 Encounter for screening for malignant neoplasm of colon: Secondary | ICD-10-CM

## 2017-05-26 LAB — IGA: IGA: 196 mg/dL (ref 68–378)

## 2017-05-26 MED ORDER — SUPREP BOWEL PREP KIT 17.5-3.13-1.6 GM/177ML PO SOLN: 1.0000 | ORAL | 0 refills | Status: DC

## 2017-05-26 NOTE — Progress Notes (Signed)
Thank you for sending this case to me. I have reviewed the entire note, and the outlined plan seems appropriate.   Henry Danis, MD  

## 2017-05-26 NOTE — Progress Notes (Signed)
Chief Complaint: Epigastric pain, cough, vomiting, constipation  HPI:  Patricia Owen is a 51 year old Caucasian female who typically follows with Dr. Loletha Carrow and was referred to me by Ria Bush, MD for a complaint of epigastric pain, cough, vomiting and constipation .      Today, the patient presents to clinic accompanied by her husband and explains that for about 3 years she has had trouble with some reflux which has been treated by various means, but over the past year or so has developed a nonproductive cough throughout the day. She then tells me that when she eats, "if it is the wrong thing" about 30 minutes later she will start coughing up a lot of phlegm and after that will have an episode of vomiting and she will then stop coughing. This occurs on a daily basis. Patient has been trying to figure this out with the help of her primary care provider and at one point was on Nexium and Zantac for 30 days and did have some improvement of symptoms but then was weaned off of this and started coughing again. Patient tells me that she was then told to maintain a very strict diet and pay attention to what was causing her symptoms. She tells me that it seems to be anything with "malt or soy". She tells me she may be gluten sensitive. Currently, the patient is not on any medication for reflux as she does not feel as though they were working. Associated symptoms include an epigastric pain which radiates through to her back.   Patient also describes worsening chronic constipation telling me that typically she will go 2-3 days in between a bowel movement and now it can be up to 6. She has never tried a laxative.   Patient asked today if she may have her screening colonoscopy at the same time as her endoscopy as she will be due for this in December.   Patient denies fever, chills, blood in her stool, melena, weight loss, fatigue, anorexia, dysphagia or symptoms that awaken her at night.  Past Medical History:    Diagnosis Date  . Asthma, mild intermittent, well-controlled 12/23/2013  . Endometriosis   . GERD (gastroesophageal reflux disease)   . History of chicken pox     Past Surgical History:  Procedure Laterality Date  . ANKLE SURGERY  2006  . BALLOON SINUPLASTY  05/29/14   Bilateral maxillary sinus  . INTRAUTERINE DEVICE (IUD) INSERTION  03/30/2015   Mirena  . SEPTOPLASTY  05/29/14  . TURBINATE REDUCTION  05/29/14   Inferior turbinate    No current outpatient prescriptions on file.   Current Facility-Administered Medications  Medication Dose Route Frequency Provider Last Rate Last Dose  . levonorgestrel (MIRENA) 20 MCG/24HR IUD   Intrauterine Once [name], [name], MD        Allergies as of 05/26/2017 - Review Complete 05/26/2017  Allergen Reaction Noted  . Benadryl [diphenhydramine hcl] Hives   . Codeine Itching 11/26/2012  . Prednisone Nausea Only 11/26/2012  . Tessalon [benzonatate] Other (See Comments) 10/20/2016    Family History  Problem Relation Age of Onset  . Diabetes Father   . Hypertension Father   . CAD Father        MI  . Congenital heart disease Father        hole in heart  . Stroke Father   . CAD Paternal Grandmother   . CAD Paternal Grandfather   . CAD Paternal Uncle 60  MI  . Cancer Neg Hx     Social History   Social History  . Marital status: Married    Spouse name: N/A  . Number of children: N/A  . Years of education: N/A   Occupational History  . Self employeed    Social History Main Topics  . Smoking status: Never Smoker  . Smokeless tobacco: Never Used  . Alcohol use 0.0 oz/week     Comment: Rare  . Drug use: No  . Sexual activity: Yes    Birth control/ protection: IUD     Comment: Mirena 03/30/2015-1st intercourse 18 yo-4 partners   Other Topics Concern  . Not on file   Social History Narrative   Lives with husband Cecilie Lowers)   [place] children   Occupation: Scientist, forensic and artist [employer]   Activity:  water aerobic 3x/wk   Diet: avoids processed foods, good water, fruits/vegetables daily, gets produce box weekly    Review of Systems:    Constitutional: No weight loss, fever or chills Cardiovascular: No chest pain Respiratory: No SOB  Gastrointestinal: See HPI and otherwise negative   Physical Exam:  Vital signs: BP 118/78   Pulse 70   Ht 5\' 4"  (1.626 m)   Wt 195 lb (88.5 kg)   SpO2 98%   BMI 33.47 kg/m   Constitutional:   Pleasant Caucasian female appears to be in NAD, Well developed, Well nourished, alert and cooperative Respiratory: Respirations even and unlabored. Lungs clear to auscultation bilaterally.   No wheezes, crackles, or rhonchi.  Cardiovascular: Normal S1, S2. No MRG. Regular rate and rhythm. No peripheral edema, cyanosis or pallor.  Gastrointestinal:  Soft, nondistended, nontender. No rebound or guarding. Normal bowel sounds. No appreciable masses or hepatomegaly. Psychiatric:  Demonstrates good judgement and reason without abnormal affect or behaviors.  MOST RECENT LABS AND IMAGING: CBC    Component Value Date/Time   WBC 7.6 04/19/2017 1156   RBC 4.68 04/19/2017 1156   HGB 13.7 04/19/2017 1156   HCT 41.3 04/19/2017 1156   PLT 239 04/19/2017 1156   MCV 88.2 04/19/2017 1156   MCH 29.3 04/19/2017 1156   MCHC 33.2 04/19/2017 1156   RDW 13.3 04/19/2017 1156   LYMPHSABS 2,508 04/19/2017 1156   MONOABS 760 04/19/2017 1156   EOSABS 0 (L) 04/19/2017 1156   BASOSABS 76 04/19/2017 1156    CMP     Component Value Date/Time   NA 139 04/19/2017 1156   K 4.6 04/19/2017 1156   CL 104 04/19/2017 1156   CO2 22 04/19/2017 1156   GLUCOSE 86 04/19/2017 1156   BUN 12 04/19/2017 1156   CREATININE 0.75 04/19/2017 1156   CALCIUM 9.8 04/19/2017 1156   PROT 7.1 04/19/2017 1156   ALBUMIN 4.5 04/19/2017 1156   AST 21 04/19/2017 1156   ALT 32 (H) 04/19/2017 1156   ALKPHOS 76 04/19/2017 1156   BILITOT 0.6 04/19/2017 1156    Assessment: 1. Epigastric pain: Chronic  for the patient, worse when "I eat the wrong thing"; Consider GERD versus hiatal hernia versus gastritis versus other 2. Cough: Chronic dry cough; question relation to reflux versus other 3. Vomiting: Episodic, seems to be worse when eating gluten per the patient 4. Constipation: Chronic for the patient, worsened recently, no previous trial of laxatives 5. Screening for colorectal cancer: Patient turning 65 in December, requesting her screening colonoscopy early  Plan: 1. Scheduled patient for an EGD and Colonoscopy in the [place] with Dr. Loletha Carrow. Did discuss risks, benefits, limitations  and alternatives and the patient agrees to proceed. 2. Discussed with the patient that she may remain off of her medicine at this time as she does not feel that it is helping. We can make further recommendations regarding use after EGD. 3. Ordered labs including iTTg and total IgA for celiac. Did discuss that biopsies can be done at time of EGD as well. 4. Would recommend the patient start daily MiraLAX. Would recommend once daily for now, can titrate to 4 doses per day as needed 5. Patient to follow in clinic per recommendations from Dr. Loletha Carrow after time of procedures.  Ellouise Newer, PA-C [facility] 05/26/2017, 2:31 PM  Cc: Ria Bush, MD

## 2017-05-26 NOTE — Patient Instructions (Signed)
You have been scheduled for an endoscopy and colonoscopy. Please follow the written instructions given to you at your visit today. Please pick up your prep supplies at the pharmacy within the next 1-3 days. If you use inhalers (even only as needed), please bring them with you on the day of your procedure. Your physician has requested that you go to www.[email or web address].[email or web address] and enter the access code given to you at your visit today. This web site gives a general overview about your procedure. However, you should still follow specific instructions given to you by our office regarding your preparation for the procedure.  Your physician has requested that you go to the basement for the following lab work before leaving today: IgA, Ttg  Please follow up with Dr Loletha Carrow as per recommendations after endoscopy and colonoscopy.  Please purchase the following medications over the counter and take as directed: miralax 17 grams (1 capful) once daily  If you are age 93 or older, your body mass index should be between 23-30. Your Body mass index is 33.47 kg/m. If this is out of the aforementioned range listed, please consider follow up with your Primary Care Provider.  If you are age 39 or younger, your body mass index should be between 19-25. Your Body mass index is 33.47 kg/m. If this is out of the aformentioned range listed, please consider follow up with your Primary Care Provider.

## 2017-05-29 HISTORY — PX: ESOPHAGOGASTRODUODENOSCOPY: SHX1529

## 2017-05-29 HISTORY — PX: COLONOSCOPY: SHX174

## 2017-05-29 LAB — TISSUE TRANSGLUTAMINASE, IGA: (tTG) Ab, IgA: 1 U/mL

## 2017-06-20 ENCOUNTER — Encounter: Payer: Self-pay | Admitting: Gastroenterology

## 2017-06-20 ENCOUNTER — Ambulatory Visit (AMBULATORY_SURGERY_CENTER): Payer: BLUE CROSS/BLUE SHIELD | Admitting: Gastroenterology

## 2017-06-20 VITALS — BP 136/82 | HR 76 | Temp 99.1°F | Resp 17 | Ht 64.0 in | Wt 195.0 lb

## 2017-06-20 DIAGNOSIS — R1013 Epigastric pain: Secondary | ICD-10-CM | POA: Diagnosis not present

## 2017-06-20 DIAGNOSIS — Z1212 Encounter for screening for malignant neoplasm of rectum: Secondary | ICD-10-CM | POA: Diagnosis not present

## 2017-06-20 DIAGNOSIS — K5900 Constipation, unspecified: Secondary | ICD-10-CM

## 2017-06-20 DIAGNOSIS — Z1211 Encounter for screening for malignant neoplasm of colon: Secondary | ICD-10-CM | POA: Diagnosis present

## 2017-06-20 DIAGNOSIS — R05 Cough: Secondary | ICD-10-CM

## 2017-06-20 DIAGNOSIS — R059 Cough, unspecified: Secondary | ICD-10-CM

## 2017-06-20 MED ORDER — SODIUM CHLORIDE 0.9 % IV SOLN: 500.0000 mL | INTRAVENOUS | Status: DC

## 2017-06-20 NOTE — Op Note (Signed)
[facility] Patient Name: Patricia Owen Procedure Date: 06/20/2017 3:21 PM MRN: [id number] Endoscopist: Mallie Mussel L. Loletha Carrow , MD Age: 51 Referring MD:  Date of Birth: November 07, 1965 Gender: Female Account #: [id number] Procedure:                Upper GI endoscopy Indications:              Epigastric abdominal pain, Chronic cough (occurs                            about 30 min after meals and did not respond to                            trial of acid suppression. pulmonary workup in 2015)                           Recent H pylori antigen negative and tTG antibody                            negative Medicines:                Monitored Anesthesia Care Procedure:                Pre-Anesthesia Assessment:                           - Prior to the procedure, a History and Physical                            was performed, and patient medications and                            allergies were reviewed. The patient's tolerance of                            previous anesthesia was also reviewed. The risks                            and benefits of the procedure and the sedation                            options and risks were discussed with the patient.                            All questions were answered, and informed consent                            was obtained. Prior Anticoagulants: The patient has                            taken no previous anticoagulant or antiplatelet                            agents. ASA Grade Assessment: II - A patient with  mild systemic disease. After reviewing the risks                            and benefits, the patient was deemed in                            satisfactory condition to undergo the procedure.                           After obtaining informed consent, the endoscope was                            passed under direct vision. Throughout the                            procedure, the patient's blood pressure, pulse, and                             oxygen saturations were monitored continuously. The                            Endoscope was introduced through the mouth, and                            advanced to the second part of duodenum. The upper                            GI endoscopy was accomplished without difficulty.                            The patient tolerated the procedure well. Scope In: Scope Out: Findings:                 The larynx was normal.                           The esophagus was normal.                           The stomach was normal.                           The cardia and gastric fundus were normal on                            retroflexion.                           The examined duodenum was normal. Complications:            No immediate complications. Estimated Blood Loss:     Estimated blood loss: none. Impression:               - Normal larynx.                           - Normal esophagus.                           -  Normal stomach.                           - Normal examined duodenum.                           - No specimens collected.                           It is not clear if this cough is GERD-related. Recommendation:           - Patient has a contact number available for                            emergencies. The signs and symptoms of potential                            delayed complications were discussed with the                            patient. Return to normal activities tomorrow.                            Written discharge instructions were provided to the                            patient.                           - Resume previous diet.                           - Continue present medications.                           - If patient is interested, schedule esophageal pH                            and impedance testing to quanitify any reflux that                            may be occurring and it's temporal correlation to                             meals and cough episodes.                           - See the other procedure note for documentation of                            additional recommendations. Henry L. Loletha Carrow, MD 06/20/2017 3:54:51 PM This report has been signed electronically.

## 2017-06-20 NOTE — Op Note (Signed)
[facility] Patient Name: Patricia Owen Procedure Date: 06/20/2017 3:21 PM MRN: [id number] Endoscopist: Mallie Mussel L. Loletha Carrow , MD Age: 51 Referring MD:  Date of Birth: 07/27/1966 Gender: Female Account #: [id number] Procedure:                Colonoscopy Indications:              Screening for colorectal malignant neoplasm, This                            is the patient's first colonoscopy Medicines:                Monitored Anesthesia Care Procedure:                Pre-Anesthesia Assessment:                           - Prior to the procedure, a History and Physical                            was performed, and patient medications and                            allergies were reviewed. The patient's tolerance of                            previous anesthesia was also reviewed. The risks                            and benefits of the procedure and the sedation                            options and risks were discussed with the patient.                            All questions were answered, and informed consent                            was obtained. Prior Anticoagulants: The patient has                            taken no previous anticoagulant or antiplatelet                            agents. ASA Grade Assessment: II - A patient with                            mild systemic disease. After reviewing the risks                            and benefits, the patient was deemed in                            satisfactory condition to undergo the procedure.  After obtaining informed consent, the colonoscope                            was passed under direct vision. Throughout the                            procedure, the patient's blood pressure, pulse, and                            oxygen saturations were monitored continuously. The                            Model PCF-[id number]) scope was introduced                            through the anus and  advanced to the the cecum,                            identified by appendiceal orifice and ileocecal                            valve. The colonoscopy was performed without                            difficulty. The patient tolerated the procedure                            well. The quality of the bowel preparation was                            excellent. The ileocecal valve, appendiceal                            orifice, and rectum were photographed. The bowel                            preparation used was SUPREP. The quality of the                            bowel preparation was evaluated using the BBPS                            [facility] Bowel Preparation Scale) with scores of:                            Right Colon = 3, Transverse Colon = 3 and Left                            Colon = 3 (entire mucosa seen well with no residual                            staining, small fragments of stool or opaque  liquid). The total BBPS score equals 9. Scope In: 3:32:30 PM Scope Out: 3:41:47 PM Scope Withdrawal Time: 0 hours 7 minutes 49 seconds  Total Procedure Duration: 0 hours 9 minutes 17 seconds  Findings:                 The perianal and digital rectal examinations were                            normal.                           The entire examined colon appeared normal on direct                            and retroflexion views. Complications:            No immediate complications. Estimated Blood Loss:     Estimated blood loss: none. Impression:               - The entire examined colon is normal on direct and                            retroflexion views.                           - No specimens collected. Recommendation:           - Patient has a contact number available for                            emergencies. The signs and symptoms of potential                            delayed complications were discussed with the                            patient.  Return to normal activities tomorrow.                            Written discharge instructions were provided to the                            patient.                           - Resume previous diet.                           - Continue present medications.                           - Repeat colonoscopy in 10 years for screening                            purposes. Kalev Temme L. Loletha Carrow, MD 06/20/2017 3:56:55 PM This report has been signed electronically.

## 2017-06-20 NOTE — Progress Notes (Signed)
Pt's states no medical or surgical changes since previsit or office visit. 

## 2017-06-20 NOTE — Patient Instructions (Signed)
UPPER ENDOSCOPY-NORMAL  COLONOSCOPY-NORMAL   DR DANIS'S OFFICE WILL SET UP ESOPHAGEAL PH TEST IF AGREED     YOU HAD AN ENDOSCOPIC PROCEDURE TODAY AT [facility]:   Refer to the procedure report that was given to you for any specific questions about what was found during the examination.  If the procedure report does not answer your questions, please call your gastroenterologist to clarify.  If you requested that your care partner not be given the details of your procedure findings, then the procedure report has been included in a sealed envelope for you to review at your convenience later.  YOU SHOULD EXPECT: Some feelings of bloating in the abdomen. Passage of more gas than usual.  Walking can help get rid of the air that was put into your GI tract during the procedure and reduce the bloating. If you had a lower endoscopy (such as a colonoscopy or flexible sigmoidoscopy) you may notice spotting of blood in your stool or on the toilet paper. If you underwent a bowel prep for your procedure, you may not have a normal bowel movement for a few days.  Please Note:  You might notice some irritation and congestion in your nose or some drainage.  This is from the oxygen used during your procedure.  There is no need for concern and it should clear up in a day or so.  SYMPTOMS TO REPORT IMMEDIATELY:   Following lower endoscopy (colonoscopy or flexible sigmoidoscopy):  Excessive amounts of blood in the stool  Significant tenderness or worsening of abdominal pains  Swelling of the abdomen that is new, acute  Fever of 100F or higher   Following upper endoscopy (EGD)  Vomiting of blood or coffee ground material  New chest pain or pain under the shoulder blades  Painful or persistently difficult swallowing  New shortness of breath  Fever of 100F or higher  Black, tarry-looking stools  For urgent or emergent issues, a gastroenterologist can be reached at any hour by calling  [phone].   DIET:  We do recommend a small meal at first, but then you may proceed to your regular diet.  Drink plenty of fluids but you should avoid alcoholic beverages for 24 hours.  ACTIVITY:  You should plan to take it easy for the rest of today and you should NOT DRIVE or use heavy machinery until tomorrow (because of the sedation medicines used during the test).    FOLLOW UP: Our staff will call the number listed on your records the next business day following your procedure to check on you and address any questions or concerns that you may have regarding the information given to you following your procedure. If we do not reach you, we will leave a message.  However, if you are feeling well and you are not experiencing any problems, there is no need to return our call.  We will assume that you have returned to your regular daily activities without incident.  If any biopsies were taken you will be contacted by phone or by letter within the next 1-3 weeks.  Please call us at [phone] if you have not heard about the biopsies in 3 weeks.    SIGNATURES/CONFIDENTIALITY: You and/or your care partner have signed paperwork which will be entered into your electronic medical record.  These signatures attest to the fact that that the information above on your After Visit Summary has been reviewed and is understood.  Full responsibility of the confidentiality  of this discharge information lies with you and/or your care-partner.

## 2017-06-20 NOTE — Progress Notes (Signed)
Spontaneous respirations throughout. VSS. Resting comfortably. To PACU on room air. Report to  RN. 

## 2017-06-21 ENCOUNTER — Telehealth: Payer: Self-pay | Admitting: *Deleted

## 2017-06-21 NOTE — Telephone Encounter (Signed)
  Follow up Call-  Call back number 06/20/2017  Post procedure Call Back phone  # [phone]  Permission to leave phone message Yes  Some recent data might be hidden     Patient questions:  Do you have a fever, pain , or abdominal swelling? No. Pain Score  0 *  Have you tolerated food without any problems? Yes.    Have you been able to return to your normal activities? Yes.    Do you have any questions about your discharge instructions: Diet   No. Medications  No. Follow up visit  No.  Do you have questions or concerns about your Care? No.  Actions: * If pain score is 4 or above: No action needed, pain <4. Patient stating still some issues with diarrhea this am.

## 2017-06-21 NOTE — Telephone Encounter (Signed)
No answer, message left for the patient. 

## 2017-06-25 ENCOUNTER — Encounter: Payer: Self-pay | Admitting: Family Medicine

## 2017-08-10 ENCOUNTER — Ambulatory Visit: Payer: BLUE CROSS/BLUE SHIELD | Admitting: Family Medicine

## 2017-08-10 ENCOUNTER — Encounter: Payer: Self-pay | Admitting: Family Medicine

## 2017-08-10 VITALS — BP 118/70 | HR 99 | Temp 98.0°F | Wt 195.0 lb

## 2017-08-10 DIAGNOSIS — N12 Tubulo-interstitial nephritis, not specified as acute or chronic: Secondary | ICD-10-CM | POA: Diagnosis not present

## 2017-08-10 DIAGNOSIS — Z23 Encounter for immunization: Secondary | ICD-10-CM | POA: Diagnosis not present

## 2017-08-10 DIAGNOSIS — R21 Rash and other nonspecific skin eruption: Secondary | ICD-10-CM

## 2017-08-10 DIAGNOSIS — R300 Dysuria: Secondary | ICD-10-CM | POA: Diagnosis not present

## 2017-08-10 LAB — POC URINALSYSI DIPSTICK (AUTOMATED)
BILIRUBIN UA: NEGATIVE
Glucose, UA: NEGATIVE
Nitrite, UA: NEGATIVE
PH UA: 6 (ref 5.0–8.0)
PROTEIN UA: NEGATIVE
RBC UA: NEGATIVE
SPEC GRAV UA: 1.02 (ref 1.010–1.025)
Urobilinogen, UA: 0.2 E.U./dL

## 2017-08-10 MED ORDER — CIPROFLOXACIN HCL 500 MG PO TABS: 500.0000 mg | ORAL_TABLET | Freq: Two times a day (BID) | ORAL | 0 refills | Status: DC

## 2017-08-10 MED ORDER — CEFTRIAXONE SODIUM 1 G IJ SOLR
1.0000 g | Freq: Once | INTRAMUSCULAR | Status: AC
  Administered 2017-08-10: 1 g via INTRAMUSCULAR

## 2017-08-10 MED ORDER — NYSTATIN 100000 UNIT/GM EX CREA: 1.0000 "application " | TOPICAL_CREAM | Freq: Two times a day (BID) | CUTANEOUS | 0 refills | Status: DC

## 2017-08-10 NOTE — Addendum Note (Signed)
Addended by: Brenton Grills on: 34/28/7681 05:29 PM   Modules accepted: Orders

## 2017-08-10 NOTE — Assessment & Plan Note (Addendum)
Clinical pyelonephritis with fever, R flank pain, ongoing dysuria. Treat with rocephin 1gm IM today then start cipro 500mg  bid x 7 days. UA and micro suspicious for infection. UCx sent. Update if not improving with treatment.

## 2017-08-10 NOTE — Progress Notes (Addendum)
BP 118/70 (BP Location: Left Arm, Patient Position: Sitting, Cuff Size: Normal)   Pulse 99   Temp 98 F (36.7 C) (Oral)   Wt 195 lb (88.5 kg)   SpO2 97%   BMI 33.47 kg/m    CC: feels like I have a UTI Subjective:    Patient ID: Patricia Owen, female    DOB: Dec 08, 1965, 51 y.o.   MRN: [id number]  HPI: Patricia Owen is a 51 y.o. female presenting on 08/10/2017 for Dysuria (Started 08/03/17. Urinates on self when coughing. Seen at [facility] on 08/08/17, had urine lab, results pending.); Rash (in anal area and vaginal area.); Flank Pain (right side); and Fever (has run a fever. Taken Advil)   5d h/o dysuria, urgency, frequency, incomplete emptying, today fever to 100.8, R flank pain. Yesterday noticed some blood in urine. No nausea/vomiting.   She was seen at [facility] urgent care on Monday and had UA checked, but states this was sent off for testing and she has not received results yet. No abx were started yet.  Treating with cranberry juice.  No recent UTI.   New blistering rash in groin - attributes to urinary leaking from coughing.  Relevant past medical, surgical, family and social history reviewed and updated as indicated. Interim medical history since our last visit reviewed. Allergies and medications reviewed and updated. No outpatient medications prior to visit.   Facility-Administered Medications Prior to Visit  Medication Dose Route Frequency Provider Last Rate Last Dose  . 0.9 %  sodium chloride infusion  500 mL Intravenous Continuous Danis, Estill Cotta III, MD      . levonorgestrel (MIRENA) 20 MCG/24HR IUD   Intrauterine Once [name], [name], MD         Per HPI unless specifically indicated in ROS section below Review of Systems     Objective:    BP 118/70 (BP Location: Left Arm, Patient Position: Sitting, Cuff Size: Normal)   Pulse 99   Temp 98 F (36.7 C) (Oral)   Wt 195 lb (88.5 kg)   SpO2 97%   BMI 33.47 kg/m   Wt Readings from Last 3 Encounters:    08/10/17 195 lb (88.5 kg)  06/20/17 195 lb (88.5 kg)  05/26/17 195 lb (88.5 kg)    Physical Exam  Constitutional: She appears well-developed and well-nourished. No distress.  Abdominal: Soft. Normal appearance and bowel sounds are normal. She exhibits no distension and no mass. There is no hepatosplenomegaly. There is tenderness in the suprapubic area. There is CVA tenderness (R). There is no rigidity, no rebound, no guarding and negative Murphy's sign.  Skin: Skin is warm and dry. Rash noted.  Macerated perianal and perineal skin with erosions without significant erythema   Psychiatric: She has a normal mood and affect.  Nursing note and vitals reviewed.  Results for orders placed or performed in visit on 08/10/17  POCT Urinalysis Dipstick (Automated)  Result Value Ref Range   Color, UA yellow    Clarity, UA clear    Glucose, UA negative    Bilirubin, UA negative    Ketones, UA trace    Spec Grav, UA 1.020 1.010 - 1.025   Blood, UA negative    pH, UA 6.0 5.0 - 8.0   Protein, UA negative    Urobilinogen, UA 0.2 0.2 or 1.0 E.U./dL   Nitrite, UA negative    Leukocytes, UA Small (1+) (A) Negative      Assessment & Plan:   Problem List Items  Addressed This Visit    Perineal rash in female    Perineal and perianal rash anticipate maceration of skin after prolonged period of wetness on recent drive with some urinary incontinence. Treatment reviewed - keep area clean, dry, use barrier cream, may use nystatin cream if not improving but currently no active signs of yeast infection.       Pyelonephritis - Primary    Clinical pyelonephritis with fever, R flank pain, ongoing dysuria. Treat with rocephin 1gm IM today then start cipro 500mg  bid x 7 days. UA and micro suspicious for infection. UCx sent. Update if not improving with treatment.       Relevant Medications   nystatin cream (MYCOSTATIN)   Other Relevant Orders   Urine Culture    Other Visit Diagnoses    Dysuria        Relevant Orders   POCT Urinalysis Dipstick (Automated) (Completed)       Follow up plan: Return if symptoms worsen or fail to improve.  Ria Bush, MD

## 2017-08-10 NOTE — Patient Instructions (Addendum)
I think you have a kidney infection - treat with rocephin antibiotic shot today then start cipro 500mg  twice daily for 1 week sent to pharmacy.  Continue A&D ointment barrier cream. Keep area clean and dry.  Prescription for antifungal cream printed today.  Flu shot today.  Let us know if not improving with treatment.   Pyelonephritis, Adult Pyelonephritis is a kidney infection. The kidneys are the organs that filter a person's blood and move waste out of the bloodstream and into the urine. Urine passes from the kidneys, through the ureters, and into the bladder. There are two main types of pyelonephritis:  Infections that come on quickly without any warning (acute pyelonephritis).  Infections that last for a long period of time (chronic pyelonephritis).  In most cases, the infection clears up with treatment and does not cause further problems. More severe infections or chronic infections can sometimes spread to the bloodstream or lead to other problems with the kidneys. What are the causes? This condition is usually caused by:  Bacteria traveling from the bladder to the kidney through infected urine. The urine in the bladder can become infected with bacteria from: ? Bladder infection (cystitis). ? Inflammation of the prostate gland (prostatitis). ? Sexual intercourse, in females.  Bacteria traveling from the bloodstream to the kidney.  What increases the risk? This condition is more likely to develop in:  Pregnant women.  Older people.  People who have diabetes.  People who have kidney stones or bladder stones.  People who have other abnormalities of the kidney or ureter.  People who have a catheter placed in the bladder.  People who have cancer.  People who are sexually active.  Women who use spermicides.  People who have had a prior urinary tract infection.  What are the signs or symptoms? Symptoms of this condition include:  Frequent urination.  Strong or  persistent urge to urinate.  Burning or stinging when urinating.  Abdominal pain.  Back pain.  Pain in the side or flank area.  Fever.  Chills.  Blood in the urine, or dark urine.  Nausea.  Vomiting.  How is this diagnosed? This condition may be diagnosed based on:  Medical history and physical exam.  Urine tests.  Blood tests.  You may also have imaging tests of the kidneys, such as an ultrasound or CT scan. How is this treated? Treatment for this condition may depend on the severity of the infection.  If the infection is mild and is found early, you may be treated with antibiotic medicines taken by mouth. You will need to drink fluids to remain hydrated.  If the infection is more severe, you may need to stay in the hospital and receive antibiotics given directly into a vein through an IV tube. You may also need to receive fluids through an IV tube if you are not able to remain hydrated. After your hospital stay, you may need to take oral antibiotics for a period of time.  Other treatments may be required, depending on the cause of the infection. Follow these instructions at home: Medicines  Take over-the-counter and prescription medicines only as told by your health care provider.  If you were prescribed an antibiotic medicine, take it as told by your health care provider. Do not stop taking the antibiotic even if you start to feel better. General instructions  Drink enough fluid to keep your urine clear or pale yellow.  Avoid caffeine, tea, and carbonated beverages. They tend to irritate the bladder.  Urinate often. Avoid holding in urine for long periods of time.  Urinate before and after sex.  After a bowel movement, women should cleanse from front to back. Use each tissue only once.  Keep all follow-up visits as told by your health care provider. This is important. Contact a health care provider if:  Your symptoms do not get better after 2 days of  treatment.  Your symptoms get worse.  You have a fever. Get help right away if:  You are unable to take your antibiotics or fluids.  You have shaking chills.  You vomit.  You have severe flank or back pain.  You have extreme weakness or fainting. This information is not intended to replace advice given to you by your health care provider. Make sure you discuss any questions you have with your health care provider. Document Released: 08/15/2005 Document Revised: 01/21/2016 Document Reviewed: 12/08/2014 Elsevier Interactive Patient Education  [employer].

## 2017-08-10 NOTE — Assessment & Plan Note (Signed)
Perineal and perianal rash anticipate maceration of skin after prolonged period of wetness on recent drive with some urinary incontinence. Treatment reviewed - keep area clean, dry, use barrier cream, may use nystatin cream if not improving but currently no active signs of yeast infection.

## 2017-08-11 LAB — URINE CULTURE
MICRO NUMBER:: 81402690
SPECIMEN QUALITY:: ADEQUATE

## 2017-08-17 ENCOUNTER — Telehealth: Payer: Self-pay

## 2017-08-17 DIAGNOSIS — B009 Herpesviral infection, unspecified: Secondary | ICD-10-CM

## 2017-08-17 NOTE — Telephone Encounter (Signed)
Copied from [place] [id number]. Topic: Inquiry >> Aug 17, 2017 11:40 AM Arletha Grippe wrote: Reason for CRM: pt 's labs that were done at [facility] clinic are ready. Pt is requesting them to be sent to Dr Darnell Level. She wanted Dr Darnell Level to be aware.

## 2017-08-17 NOTE — Telephone Encounter (Signed)
Noted. Thanks.

## 2017-08-25 DIAGNOSIS — B009 Herpesviral infection, unspecified: Secondary | ICD-10-CM | POA: Insufficient documentation

## 2017-08-25 MED ORDER — VALACYCLOVIR HCL 1 G PO TABS: 1000.0000 mg | ORAL_TABLET | Freq: Two times a day (BID) | ORAL | 0 refills | Status: DC

## 2017-08-25 NOTE — Telephone Encounter (Signed)
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  Spoke with patient she had not yet received results. Reviewed results with her. She has family with h/o HSV1. Symptoms largely resolved, but rash slow to heal and persists. Will treat with 7d valtrex course for primary outbreak.

## 2017-08-25 NOTE — Addendum Note (Signed)
Addended by: Ria Bush on: 08/25/2017 06:05 PM   Modules accepted: Orders

## 2017-09-20 ENCOUNTER — Encounter: Payer: Self-pay | Admitting: Family Medicine

## 2017-09-20 ENCOUNTER — Ambulatory Visit: Payer: BLUE CROSS/BLUE SHIELD | Admitting: Family Medicine

## 2017-09-20 VITALS — BP 120/80 | HR 89 | Temp 98.3°F | Wt 195.0 lb

## 2017-09-20 DIAGNOSIS — R053 Chronic cough: Secondary | ICD-10-CM

## 2017-09-20 DIAGNOSIS — E739 Lactose intolerance, unspecified: Secondary | ICD-10-CM | POA: Diagnosis not present

## 2017-09-20 DIAGNOSIS — N809 Endometriosis, unspecified: Secondary | ICD-10-CM | POA: Diagnosis not present

## 2017-09-20 DIAGNOSIS — R05 Cough: Secondary | ICD-10-CM | POA: Diagnosis not present

## 2017-09-20 DIAGNOSIS — R1013 Epigastric pain: Secondary | ICD-10-CM | POA: Diagnosis not present

## 2017-09-20 NOTE — Progress Notes (Signed)
BP 120/80 (BP Location: Left Arm, Patient Position: Sitting, Cuff Size: Normal)   Pulse 89   Temp 98.3 F (36.8 C) (Oral)   Wt 195 lb (88.5 kg)   SpO2 97%   BMI 33.47 kg/m    CC: ongoing GI concerns Subjective:    Patient ID: Patricia Owen, female    DOB: 10/08/1965, 52 y.o.   MRN: [id number]  HPI: Patricia Owen is a 52 y.o. female presenting on 09/20/2017 for Follow-up (Wants to discuss colonoscopy and endoscopy results)   See prior notes including GI note for details. antiTTg and IgA were normal. Ongoing epigastric pain as well as episodic vomiting, constipation, and persistent dry cough with fits that lead to gagging/vomiting mucous. This can happen with or without food. Miralax capful daily was started for constipation as well as daily prunes and prune juice - this has helped her have about 1 large bowel movement per week, on other days has very small amt stools pencil thin size, at times sticky tarry. Drinks 1 gallon water daily. Endorses constant bloated full feeling. Some early satiety. Some abdominal cramping and lower back pain that improves with BM.   Denies dysphagia. No stool urgency, does not feel need to have BM.  She has cut out breads and cut back on lactose.  Milk, ice cream causes vomiting - thick clear mucous.  GERD diet did not help.   Ongoing chronic dry cough - initially improved with daily nexium/zantac, no longer. Not improved with flonase.   COLONOSCOPY 05/2017 WNL ([facility]) ESOPHAGOGASTRODUODENOSCOPY 05/2017 WNL ([facility])  H/o endometriosis that improved on Mirena so did not need laparoscopy.   Relevant past medical, surgical, family and social history reviewed and updated as indicated. Interim medical history since our last visit reviewed. Allergies and medications reviewed and updated. Outpatient Medications Prior to Visit  Medication Sig Dispense Refill  . nystatin cream (MYCOSTATIN) Apply 1 application topically 2 (two) times daily. 30 g 0  . valACYclovir  (VALTREX) 1000 MG tablet Take 1 tablet (1,000 mg total) by mouth 2 (two) times daily. 14 tablet 0   Facility-Administered Medications Prior to Visit  Medication Dose Route Frequency Provider Last Rate Last Dose  . levonorgestrel (MIRENA) 20 MCG/24HR IUD   Intrauterine Once [name], [name], MD         Per HPI unless specifically indicated in ROS section below Review of Systems     Objective:    BP 120/80 (BP Location: Left Arm, Patient Position: Sitting, Cuff Size: Normal)   Pulse 89   Temp 98.3 F (36.8 C) (Oral)   Wt 195 lb (88.5 kg)   SpO2 97%   BMI 33.47 kg/m   Wt Readings from Last 3 Encounters:  09/20/17 195 lb (88.5 kg)  08/10/17 195 lb (88.5 kg)  06/20/17 195 lb (88.5 kg)    Physical Exam  Constitutional: She appears well-developed and well-nourished. No distress.  HENT:  Mouth/Throat: Oropharynx is clear and moist. No oropharyngeal exudate.  Eyes: Conjunctivae and EOM are normal. Pupils are equal, round, and reactive to light.  Neck: Normal range of motion. Neck supple. No thyromegaly present.  Cardiovascular: Normal rate, regular rhythm, normal heart sounds and intact distal pulses.  No murmur heard. Pulmonary/Chest: Effort normal and breath sounds normal. No respiratory distress. She has no wheezes. She has no rales.  Abdominal: Soft. Normal appearance and bowel sounds are normal. She exhibits no distension and no mass. There is no hepatosplenomegaly. There is no tenderness. There is no rigidity, no  rebound, no guarding, no CVA tenderness and negative Murphy's sign.  No significant abdominal pain   Musculoskeletal: She exhibits no edema.  Lymphadenopathy:    She has no cervical adenopathy.  Skin: Skin is warm and dry. No rash noted.  Psychiatric: She has a normal mood and affect.  Nursing note and vitals reviewed.  Lab Results  Component Value Date   WBC 7.6 04/19/2017   HGB 13.7 04/19/2017   HCT 41.3 04/19/2017   MCV 88.2 04/19/2017   PLT 239  04/19/2017    Lab Results  Component Value Date   CREATININE 0.75 04/19/2017   BUN 12 04/19/2017   NA 139 04/19/2017   K 4.6 04/19/2017   CL 104 04/19/2017   CO2 22 04/19/2017    Lab Results  Component Value Date   ALT 32 (H) 04/19/2017   AST 21 04/19/2017   ALKPHOS 76 04/19/2017   BILITOT 0.6 04/19/2017       Assessment & Plan:   Problem List Items Addressed This Visit    Chronic cough   Endometriosis    H/o this, improved on mirena. Doubt GI-endometriosis as this should have also improved with mirena?      Epigastric discomfort - Primary    Vitals stable. Labs ok 03/2017. Benign abdominal exam. Recent reassuring EGD, colonoscopy.  Continues to describe intermittent epigastric pain associated with constipation, episodic vomiting with dry cough. She also endorses some early satiety, bloating/gassiness, and persistent fullness.  Previously treated for GERD with antihistamine/PPI course with initial improvement but now not helping. miralax has helped stool regularity but persistent constipation endorsed - I suggested increasing to 1 capful BID.  ?IBS vs gastroparesis vs other. I did recommend exclude gas producing foods, and trial of daily probiotic. Offered trial levsin - pt declines. I did recommend f/u with GI to help r/o gastroparesis. Would consider amitiza/linzess pending effect of above.  No abdominal imaging has been performed. Will check abd [place] eval liver, gallbladder, pancreas.       Relevant Orders   US Abdomen Complete   Lactose intolerance in adult    Describes this - advised continue avoiding lactose in diet.           Follow up plan: Return if symptoms worsen or fail to improve.  Ria Bush, MD

## 2017-09-20 NOTE — Patient Instructions (Addendum)
?  irritable bowel syndrome vs gastroparesis.  Increase miralax to 1 capful twice daily.  Increase fiber and water. Exclude gas producing foods (beans, onions, celery, carrots, raisins, bananas, apricots, prunes, brussel sprouts, wheat germ, pretzels) Continue lactose free diet (back off milk) Trial of align for bloating/IBS symptoms (OTC) or trial of activia for constipation (OTC)  Call and schedule f/u with Dr Loletha Carrow for recheck  Let me know how you do with above.

## 2017-09-23 ENCOUNTER — Encounter: Payer: Self-pay | Admitting: Family Medicine

## 2017-09-23 DIAGNOSIS — E739 Lactose intolerance, unspecified: Secondary | ICD-10-CM | POA: Insufficient documentation

## 2017-09-23 NOTE — Assessment & Plan Note (Signed)
H/o this, improved on mirena. Doubt GI-endometriosis as this should have also improved with mirena?

## 2017-09-23 NOTE — Assessment & Plan Note (Addendum)
Vitals stable. Labs ok 03/2017. Benign abdominal exam. Recent reassuring EGD, colonoscopy.  Continues to describe intermittent epigastric pain associated with constipation, episodic vomiting with dry cough. She also endorses some early satiety, bloating/gassiness, and persistent fullness.  Previously treated for GERD with antihistamine/PPI course with initial improvement but now not helping. miralax has helped stool regularity but persistent constipation endorsed - I suggested increasing to 1 capful BID.  ?IBS vs gastroparesis vs other. I did recommend exclude gas producing foods, and trial of daily probiotic. Offered trial levsin - pt declines. I did recommend f/u with GI to help r/o gastroparesis. Would consider amitiza/linzess pending effect of above.  No abdominal imaging has been performed. Will check abd [place] eval liver, gallbladder, pancreas.

## 2017-09-23 NOTE — Assessment & Plan Note (Signed)
Describes this - advised continue avoiding lactose in diet.

## 2017-10-16 ENCOUNTER — Ambulatory Visit
Admission: RE | Admit: 2017-10-16 | Discharge: 2017-10-16 | Disposition: A | Discharge status: Home / Self Care | Payer: BLUE CROSS/BLUE SHIELD | Source: Ambulatory Visit | Attending: Family Medicine | Admitting: Family Medicine

## 2017-10-16 DIAGNOSIS — R1013 Epigastric pain: Secondary | ICD-10-CM | POA: Diagnosis present

## 2017-10-16 DIAGNOSIS — R932 Abnormal findings on diagnostic imaging of liver and biliary tract: Secondary | ICD-10-CM | POA: Insufficient documentation

## 2017-10-20 ENCOUNTER — Encounter: Payer: Self-pay | Admitting: Family Medicine

## 2017-10-20 DIAGNOSIS — K828 Other specified diseases of gallbladder: Secondary | ICD-10-CM

## 2017-10-24 DIAGNOSIS — K828 Other specified diseases of gallbladder: Secondary | ICD-10-CM | POA: Insufficient documentation

## 2017-10-24 MED ORDER — LUBIPROSTONE 8 MCG PO CAPS: 8.0000 ug | ORAL_CAPSULE | Freq: Two times a day (BID) | ORAL | 1 refills | Status: DC

## 2017-11-02 ENCOUNTER — Ambulatory Visit: Payer: BLUE CROSS/BLUE SHIELD | Admitting: Family Medicine

## 2017-11-02 ENCOUNTER — Encounter: Payer: Self-pay | Admitting: Family Medicine

## 2017-11-02 VITALS — BP 124/82 | HR 105 | Temp 98.2°F | Wt 190.2 lb

## 2017-11-02 DIAGNOSIS — R1013 Epigastric pain: Secondary | ICD-10-CM | POA: Diagnosis not present

## 2017-11-02 DIAGNOSIS — K828 Other specified diseases of gallbladder: Secondary | ICD-10-CM

## 2017-11-02 DIAGNOSIS — K219 Gastro-esophageal reflux disease without esophagitis: Secondary | ICD-10-CM

## 2017-11-02 DIAGNOSIS — K760 Fatty (change of) liver, not elsewhere classified: Secondary | ICD-10-CM

## 2017-11-02 DIAGNOSIS — R05 Cough: Secondary | ICD-10-CM

## 2017-11-02 DIAGNOSIS — R053 Chronic cough: Secondary | ICD-10-CM

## 2017-11-02 MED ORDER — ESOMEPRAZOLE MAGNESIUM 40 MG PO CPDR: 40.0000 mg | DELAYED_RELEASE_CAPSULE | Freq: Every day | ORAL | 1 refills | Status: DC

## 2017-11-02 NOTE — Assessment & Plan Note (Signed)
Reviewed dx, discussed NASH vs HS - anticipate HS. Reviewed optimizing cholesterol, sugar control, working on healthy diet and weight loss.

## 2017-11-02 NOTE — Patient Instructions (Addendum)
Keep food diary to find any triggers.  Trial nexium 40mg  for heartburn symptoms daily for 3 weeks.  Return to see Dr Loletha Carrow for further evaluation - see Rosaria Ferries for appointment.   Fatty Liver Fatty liver, also called hepatic steatosis or steatohepatitis, is a condition in which too much fat has built up in your liver cells. The liver removes harmful substances from your bloodstream. It produces fluids your body needs. It also helps your body use and store energy from the food you eat. In many cases, fatty liver does not cause symptoms or problems. It is often diagnosed when tests are being done for other reasons. However, over time, fatty liver can cause inflammation that may lead to more serious liver problems, such as scarring of the liver (cirrhosis). What are the causes? Causes of fatty liver may include:  Drinking too much alcohol.  Poor nutrition.  Obesity.  Cushing syndrome.  Diabetes.  Hyperlipidemia.  Pregnancy.  Certain drugs.  Poisons.  Some viral infections.  What increases the risk? You may be more likely to develop fatty liver if you:  Abuse alcohol.  Are pregnant.  Are overweight.  Have diabetes.  Have hepatitis.  Have a high triglyceride level.  What are the signs or symptoms? Fatty liver often does not cause any symptoms. In cases where symptoms develop, they can include:  Fatigue.  Weakness.  Weight loss.  Confusion.  Abdominal pain.  Yellowing of your skin and the white parts of your eyes (jaundice).  Nausea and vomiting.  How is this diagnosed? Fatty liver may be diagnosed by:  Physical exam and medical history.  Blood tests.  Imaging tests, such as an ultrasound, CT scan, or MRI.  Liver biopsy. A small sample of liver tissue is removed using a needle. The sample is then looked at under a microscope.  How is this treated? Fatty liver is often caused by other health conditions. Treatment for fatty liver may involve medicines  and lifestyle changes to manage conditions such as:  Alcoholism.  High cholesterol.  Diabetes.  Being overweight or obese.  Follow these instructions at home:  Eat a healthy diet as directed by your health care provider.  Exercise regularly. This can help you lose weight and control your cholesterol and diabetes. Talk to your health care provider about an exercise plan and which activities are best for you.  Do not drink alcohol.  Take medicines only as directed by your health care provider. Contact a health care provider if: You have difficulty controlling your:  Blood sugar.  Cholesterol.  Alcohol consumption.  Get help right away if:  You have abdominal pain.  You have jaundice.  You have nausea and vomiting. This information is not intended to replace advice given to you by your health care provider. Make sure you discuss any questions you have with your health care provider. Document Released: 09/30/2005 Document Revised: 01/21/2016 Document Reviewed: 12/25/2013 Elsevier Interactive Patient Education  [employer].

## 2017-11-02 NOTE — Assessment & Plan Note (Signed)
Possible small amt - found on recent abd [place]. Reviewed with patient. She does endorse radiation of discomfort to R shoulder blade region - but I don't think this is related to gallbladder sludge. Would consider HIDA scan. Will refer to GI for further eval, consider gen surg eval.

## 2017-11-02 NOTE — Assessment & Plan Note (Addendum)
Ongoing episodes of sharp and burning epigastric pain associated with cough and vomiting. Unclear etiology. Will refer back to GI for further evaluation ?gastroparesis vs biliary cause vs other. Start nexium 40mg  daily in interim.  Pt agrees with plan.

## 2017-11-02 NOTE — Progress Notes (Signed)
BP 124/82 (BP Location: Left Arm, Patient Position: Sitting, Cuff Size: Normal)   Pulse (!) 105   Temp 98.2 F (36.8 C) (Oral)   Wt 190 lb 4 oz (86.3 kg)   SpO2 98%   BMI 32.66 kg/m    CC: discuss imaging results Subjective:    Patient ID: Patricia Owen, female    DOB: July 24, 1966, 52 y.o.   MRN: [id number]  HPI: Patricia Owen is a 52 y.o. female presenting on 11/02/2017 for Follow-up (Pt wants to discuss ultrasound results from GI doctor. Pt states that she is still having the epigastric pain along with pain in the right shoulder blade.)   See prior notes including GI note for details. antiTTg and IgA were normal. EGD/colonoscopy WNL.   Persistent sharp and burning epigastric pain and bloating after meals worse after laying down with radiation to R shoulder blade. Persistent dry cough with gagging and episodes of vomiting. Has not found triggering food. Continues to avoid lactose.  Denies early satiety, dysphagia.  Frustrated with persistence of symptoms without clear cause found yet.   She has started eating healthier diet due to husband's recent diabetes dx - she is using My[employer] app on phone to track diet. 5 lb weight loss. Constipation issues have improved with healthier diet. Now having daily bowel movements. Now off miralax, did not try amitiza.   Chronic cough did not improve with nasal steroid or PPI/zantac.   COLONOSCOPY 05/2017 WNL ([facility]) ESOPHAGOGASTRODUODENOSCOPY 05/2017 WNL ([facility])      Relevant past medical, surgical, family and social history reviewed and updated as indicated. Interim medical history since our last visit reviewed. Allergies and medications reviewed and updated. Outpatient Medications Prior to Visit  Medication Sig Dispense Refill  . nystatin cream (MYCOSTATIN) Apply 1 application topically 2 (two) times daily. 30 g 0  . lubiprostone (AMITIZA) 8 MCG capsule Take 1 capsule (8 mcg total) by mouth 2 (two) times daily with a meal. (Patient not taking:  Reported on 11/02/2017) 60 capsule 1   Facility-Administered Medications Prior to Visit  Medication Dose Route Frequency Provider Last Rate Last Dose  . levonorgestrel (MIRENA) 20 MCG/24HR IUD   Intrauterine Once [name], [name], MD         Per HPI unless specifically indicated in ROS section below Review of Systems     Objective:    BP 124/82 (BP Location: Left Arm, Patient Position: Sitting, Cuff Size: Normal)   Pulse (!) 105   Temp 98.2 F (36.8 C) (Oral)   Wt 190 lb 4 oz (86.3 kg)   SpO2 98%   BMI 32.66 kg/m   Wt Readings from Last 3 Encounters:  11/02/17 190 lb 4 oz (86.3 kg)  09/20/17 195 lb (88.5 kg)  08/10/17 195 lb (88.5 kg)    Physical Exam  Constitutional: She appears well-developed and well-nourished. No distress.  Nursing note and vitals reviewed.  US Abdomen Complete CLINICAL DATA:  52 year old female with epigastric discomfort, nausea and vomiting for 4 years. Initial encounter.  EXAM: ABDOMEN ULTRASOUND COMPLETE  COMPARISON:  None.  FINDINGS: Gallbladder: Question small amount of gallbladder sludge. No gallstones or wall thickening visualized. No sonographic Murphy sign noted by sonographer.  Common bile duct: Diameter: 4.1 mm  Liver: Liver of increased echogenicity consistent with fatty infiltration and/or hepatocellular disease. No focal mass or intrahepatic biliary duct dilated. Portal vein is patent on color Doppler imaging with normal direction of blood flow towards the liver.  IVC: No abnormality visualized.  Pancreas: Suboptimally  evaluated secondary to habitus and bowel gas. Portions visualized unremarkable.  Spleen: Size and appearance within normal limits.  Right Kidney: Length: 11.4 cm. Echogenicity within normal limits. No mass or hydronephrosis visualized.  Left Kidney: Length: 10.7 cm. Echogenicity within normal limits. Minimally lobulated contour. No mass or hydronephrosis visualized.  Abdominal aorta: No aneurysm  visualized.  Other findings: None.  IMPRESSION: Question small amount of gallbladder sludge. Gallbladder otherwise unremarkable.  Liver of increased echogenicity consistent with fatty infiltration and/or hepatocellular disease. No focal hepatic mass noted.  Pancreas suboptimally evaluated secondary to bowel gas. The portions which are visualized appear unremarkable.  Electronically Signed   By: Genia Del M.D.   On: 10/16/2017 08:46   Lab Results  Component Value Date   ALT 32 (H) 04/19/2017   AST 21 04/19/2017   ALKPHOS 76 04/19/2017   BILITOT 0.6 04/19/2017   Lab Results  Component Value Date   CREATININE 0.75 04/19/2017   BUN 12 04/19/2017   NA 139 04/19/2017   K 4.6 04/19/2017   CL 104 04/19/2017   CO2 22 04/19/2017    Lab Results  Component Value Date   WBC 7.6 04/19/2017   HGB 13.7 04/19/2017   HCT 41.3 04/19/2017   MCV 88.2 04/19/2017   PLT 239 04/19/2017       Assessment & Plan:  Over 25 minutes were spent face-to-face with the patient during this encounter and >50% of that time was spent on counseling and coordination of care  Problem List Items Addressed This Visit    Chronic cough   Epigastric discomfort - Primary    Ongoing episodes of sharp and burning epigastric pain associated with cough and vomiting. Unclear etiology. Will refer back to GI for further evaluation ?gastroparesis vs biliary cause vs other. Start nexium 40mg  daily in interim.  Pt agrees with plan.       Relevant Orders   Ambulatory referral to Gastroenterology   Fatty liver    Reviewed dx, discussed NASH vs HS - anticipate HS. Reviewed optimizing cholesterol, sugar control, working on healthy diet and weight loss.       Gallbladder sludge    Possible small amt - found on recent abd [place]. Reviewed with patient. She does endorse radiation of discomfort to R shoulder blade region - but I don't think this is related to gallbladder sludge. Would consider HIDA scan. Will refer to GI  for further eval, consider gen surg eval.       Relevant Orders   Ambulatory referral to Gastroenterology   Gastroesophageal reflux disease    Start nexium 40mg  daily x 3 wks trial.       Relevant Medications   esomeprazole (NEXIUM) 40 MG capsule   Other Relevant Orders   Ambulatory referral to Gastroenterology       Meds ordered this encounter  Medications  . esomeprazole (NEXIUM) 40 MG capsule    Sig: Take 1 capsule (40 mg total) by mouth daily.    Dispense:  30 capsule    Refill:  1   Orders Placed This Encounter  Procedures  . Ambulatory referral to Gastroenterology    Referral Priority:   Routine    Referral Type:   Consultation    Referral Reason:   Specialty Services Required    Number of Visits Requested:   1    Follow up plan: No Follow-up on file.  Ria Bush, MD

## 2017-11-02 NOTE — Assessment & Plan Note (Signed)
Start nexium 40mg  daily x 3 wks trial.

## 2017-11-07 NOTE — Progress Notes (Signed)
[facility] Progress Note  Chief Complaint: Chronic cough  Subjective  History:  Patricia Owen follows up for dyspepsia and cough.  See prior office notes for details.  She has a chronic cough that is not clearly reflux related.  She has epigastric pain and bloating.  She has constipation as well.  Upper endoscopy and colonoscopy October 2018 were normal. Recent abdominal ultrasound showed "questionable gallbladder sludge". ENT thought GERD for cough, gave her trial of MDI prn. She was seen by Dr. Halford Chessman in 2015 for this chronic cough, had PFTs and was then sent to ENT out of concern this may be sinus/postnasal drip related with deviated septum.  [name] says she had surgery for the septum and some of the sinus problems improved, but she still has intermittent congestion and postnasal drip, especially during allergy season.  She complains of a burning midepigastric discomfort same as before.  She also has some pain in the right shoulder blade, but it is not clear those 2 are related.  That pain is worse when laying back, not meal related.  She also has upper abdominal bloating as before.  She is still bothered by several years of this chronic dry cough which she feels might happen anywhere from 30 minutes to 2 hours after meals, so  she and primary care both still have concerns it may be reflux related.  ROS: Cardiovascular:  no chest pain Respiratory: no dyspnea Remainder systems negative except as above The patient's Past Medical, Family and Social History were reviewed and are on file in the EMR.  Objective:  Med list reviewed  Current Outpatient Medications:  .  esomeprazole (NEXIUM) 40 MG capsule, Take 1 capsule (40 mg total) by mouth daily., Disp: 30 capsule, Rfl: 1 .  nystatin cream (MYCOSTATIN), Apply 1 application topically 2 (two) times daily., Disp: 30 g, Rfl: 0 .  Probiotic Product (ALIGN) 4 MG CAPS, Take 1 capsule by mouth daily., Disp: , Rfl:   Current  Facility-Administered Medications:  .  levonorgestrel (MIRENA) 20 MCG/24HR IUD, , Intrauterine, Once, [name], [name], MD   Vital signs in last 24 hrs: Vitals:   11/08/17 0947  BP: 122/82  Pulse: 76    Physical Exam   HEENT: sclera anicteric, oral mucosa moist without lesions.  Normal vocal quality, intermittent dry cough  Neck: supple, no thyromegaly, JVD or lymphadenopathy  Cardiac: RRR without murmurs, S1S2 heard, no peripheral edema  Pulm: clear to auscultation bilaterally, normal RR and effort noted  Abdomen: soft, mild bandlike upper abdominal tenderness as before, with active bowel sounds. No guarding or palpable hepatosplenomegaly.  Skin; warm and dry, no jaundice or rash  Recent Labs:  None recent  CBC Latest Ref Rng & Units 04/19/2017 03/08/2016 02/02/2015  WBC 3.8 - 10.8 K/uL 7.6 7.8 8.8  Hemoglobin 11.7 - 15.5 g/dL 13.7 13.8 13.5  Hematocrit 35.0 - 45.0 % 41.3 41.7 41.2  Platelets 140 - 400 K/uL 239 226 283   CMP Latest Ref Rng & Units 04/19/2017 10/20/2016 04/20/2016  Glucose 65 - 99 mg/dL 86 - 97  BUN 7 - 25 mg/dL 12 - 14  Creatinine 0.50 - 1.10 mg/dL 0.75 - 0.80  Sodium 135 - 146 mmol/L 139 - 139  Potassium 3.5 - 5.3 mmol/L 4.6 - 4.3  Chloride 98 - 110 mmol/L 104 - 105  CO2 20 - 32 mmol/L 22 - 25  Calcium 8.6 - 10.2 mg/dL 9.8 - 9.3  Total Protein 6.1 - 8.1 g/dL 7.1 7.1  6.6  Total Bilirubin 0.2 - 1.2 mg/dL 0.6 0.3 0.6  Alkaline Phos 33 - 115 U/L 76 77 71  AST 10 - 35 U/L 21 24 29   ALT 6 - 29 U/L 32(H) 34 50(H)     Radiologic studies: Ultrasound shows "questionable" gallbladder sludge, no stones or wall thickening.  Coarse hepatic echotexture consistent with fatty liver.  @ASSESSMENTPLANBEGIN @ Assessment: Encounter Diagnoses  Name Primary?  . Chronic cough   . Abdominal bloating Yes  . Abdominal pain, chronic, epigastric    He continues to be uncertain whether this cough is reflux related.  I suspect the only way we might be able to tell that  with greater certainty is a wireless esophageal pH device with symptom monitor to see if any reflux events that occur are temporarily related to reported cough.  I described the procedure in detail and she would like to give it some more consideration after discussing it with her husband.  If that is performed and suggests that there is significant reflux that is related to the cough events, she would then need a gastric emptying study to make sure there is no element of delayed gastric emptying. If it were then determined that there is no significant reflux or correlation to the cough, then reevaluation by pulmonary would seem to be in order.  Plan: Continue current management for now, and I will wait to hear from her with a decision.   Total time 30 minutes, over half spent in counseling and coordination of care.   [name]

## 2017-11-08 ENCOUNTER — Ambulatory Visit: Payer: BLUE CROSS/BLUE SHIELD | Admitting: Gastroenterology

## 2017-11-08 ENCOUNTER — Encounter: Payer: Self-pay | Admitting: Gastroenterology

## 2017-11-08 VITALS — BP 122/82 | HR 76 | Ht 64.0 in | Wt 189.8 lb

## 2017-11-08 DIAGNOSIS — R053 Chronic cough: Secondary | ICD-10-CM

## 2017-11-08 DIAGNOSIS — R1013 Epigastric pain: Secondary | ICD-10-CM | POA: Diagnosis not present

## 2017-11-08 DIAGNOSIS — R140 Abdominal distension (gaseous): Secondary | ICD-10-CM | POA: Diagnosis not present

## 2017-11-08 DIAGNOSIS — G8929 Other chronic pain: Secondary | ICD-10-CM | POA: Diagnosis not present

## 2017-11-08 DIAGNOSIS — R05 Cough: Secondary | ICD-10-CM | POA: Diagnosis not present

## 2017-11-08 NOTE — Patient Instructions (Signed)
If you are age 51 or older, your body mass index should be between 23-30. Your Body mass index is 32.58 kg/m. If this is out of the aforementioned range listed, please consider follow up with your Primary Care Provider.  If you are age 36 or younger, your body mass index should be between 19-25. Your Body mass index is 32.58 kg/m. If this is out of the aformentioned range listed, please consider follow up with your Primary Care Provider.   It has been recommended to you by your physician that you have a(n) EGD with [facility] completed. Per your request, we did not schedule the procedure(s) today. Please contact our office at [phone] should you decide to have the procedure completed.  Thank you for choosing [facility].  Patricia Lund, MD

## 2018-01-09 ENCOUNTER — Other Ambulatory Visit: Payer: Self-pay | Admitting: Family Medicine

## 2018-01-09 NOTE — Telephone Encounter (Signed)
Last OV stated this was a trial medication. Was unsure if refills were to be given.

## 2018-04-20 ENCOUNTER — Encounter: Payer: BLUE CROSS/BLUE SHIELD | Admitting: Gynecology

## 2018-05-08 ENCOUNTER — Encounter: Payer: Self-pay | Admitting: Gynecology

## 2018-05-08 ENCOUNTER — Ambulatory Visit (INDEPENDENT_AMBULATORY_CARE_PROVIDER_SITE_OTHER): Payer: Self-pay | Admitting: Gynecology

## 2018-05-08 VITALS — BP 140/90 | Ht 64.0 in | Wt 184.6 lb

## 2018-05-08 DIAGNOSIS — Z01419 Encounter for gynecological examination (general) (routine) without abnormal findings: Secondary | ICD-10-CM

## 2018-05-08 DIAGNOSIS — N951 Menopausal and female climacteric states: Secondary | ICD-10-CM

## 2018-05-08 DIAGNOSIS — Z13220 Encounter for screening for lipoid disorders: Secondary | ICD-10-CM

## 2018-05-08 DIAGNOSIS — Z1151 Encounter for screening for human papillomavirus (HPV): Secondary | ICD-10-CM

## 2018-05-08 NOTE — Patient Instructions (Addendum)
Follow-up in 1 year for annual exam, sooner if any issues   Call to Schedule your mammogram  Facilities in [place]: 1)  [facility]. [address] [place] Phone: [phone] 2)  Dr. Isaiah Blakes at [facility] [address] Phone: [phone]     Mammogram A mammogram is an X-ray test to find changes in a woman's breast. You should get a mammogram if:  You are 51 years of age or older  You have risk factors.   Your doctor recommends that you have one.  BEFORE THE TEST  Do not schedule the test the week before your period, especially if your breasts are sore during this time.  On the day of your mammogram:  Wash your breasts and armpits well. After washing, do not put on any deodorant or talcum powder on until after your test.   Eat and drink as you usually do.   Take your medicines as usual.   If you are diabetic and take insulin, make sure you:   Eat before coming for your test.   Take your insulin as usual.   If you cannot keep your appointment, call before the appointment to cancel. Schedule another appointment.  TEST  You will need to undress from the waist up. You will put on a hospital gown.   Your breast will be put on the mammogram machine, and it will press firmly on your breast with a piece of plastic called a compression paddle. This will make your breast flatter so that the machine can X-ray all parts of your breast.   Both breasts will be X-rayed. Each breast will be X-rayed from above and from the side. An X-ray might need to be taken again if the picture is not good enough.   The mammogram will last about 15 to 30 minutes.  AFTER THE TEST Finding out the results of your test Ask when your test results will be ready. Make sure you get your test results.  Document Released: 11/11/2008 Document Revised: 08/04/2011 Document Reviewed: 11/11/2008 [facility] Patient Information 2012  [place].

## 2018-05-08 NOTE — Addendum Note (Signed)
Addended by: Thurnell Garbe A on: 05/08/2018 11:28 AM   Modules accepted: Orders

## 2018-05-08 NOTE — Progress Notes (Signed)
    Patricia Owen 1966-05-30 [id number]        52 y.o.  G1P0010 for annual gynecologic exam.  Has noticed that she is developing some night sweats.  No significant hot flushes throughout the day.  Has Mirena in place and does no bleeding.  Past medical history,surgical history, problem list, medications, allergies, family history and social history were all reviewed and documented as reviewed in the EPIC chart.  ROS:  Performed with pertinent positives and negatives included in the history, assessment and plan.   Additional significant findings : None   Exam: Copywriter, advertising Vitals:   05/08/18 1052  BP: 140/90  Weight: 184 lb 9.6 oz (83.7 kg)  Height: 5\' 4"  (1.626 m)   Body mass index is 31.69 kg/m.  General appearance:  Normal affect, orientation and appearance. Skin: Grossly normal HEENT: Without gross lesions.  No cervical or supraclavicular adenopathy. Thyroid normal.  Lungs:  Clear without wheezing, rales or rhonchi Cardiac: RR, without RMG Abdominal:  Soft, nontender, without masses, guarding, rebound, organomegaly or hernia Breasts:  Examined lying and sitting without masses, retractions, discharge or axillary adenopathy. Pelvic:  Ext, BUS, Vagina: Normal  Cervix: Normal.  IUD string visualized.  Pap smear/HPV  Uterus: Anteverted, normal size, shape and contour, midline and mobile nontender   Adnexa: Without masses or tenderness    Anus and perineum: Normal   Rectovaginal: Normal sphincter tone without palpated masses or tenderness.    Assessment/Plan:  52 y.o. G45P0010 female for annual gynecologic exam without menses, Mirena IUD.   1. Mirena IUD 03/2015.  Doing well with IUD string visualized. 2. Menopausal symptoms.  We reviewed the perimenopause and what to expect.  At this point the patient is going to try OTC soy products to see if this does not help with her night sweats.  Will check baseline TSH to rule out thyroid dysfunction.  If symptoms worsen and she wants to  discuss ERT then she will represent for that discussion. 3. Pap smear/HPV 11/2013.  Pap smear/HPV today.  No history of abnormal Pap smears.  Will continue with screening every 5 years with Pap smear/HPV per current screening guidelines. 4. Mammography 2017.  I again strongly recommended patient schedule screening mammogram.  Most common cancer in women discussed.  Patient agrees to call and schedule.  Breast exam normal today. 5. Colonoscopy 2018.  Repeat at their recommended interval. 6. Health maintenance.  Blood pressure 140/90 discussed.  Need to recheck in a non-exam situation reviewed.  If remains elevated will need to follow-up with primary physician.  Patient requests baseline labs.  CBC, CMP, lipid profile, TSH ordered.  Follow-up 1 year, sooner as needed.   Anastasio Auerbach MD, 11:21 AM 05/08/2018

## 2018-05-09 ENCOUNTER — Encounter: Payer: Self-pay | Admitting: Gynecology

## 2018-05-09 LAB — COMPREHENSIVE METABOLIC PANEL
AG Ratio: 1.8 (calc) (ref 1.0–2.5)
ALBUMIN MSPROF: 4.8 g/dL (ref 3.6–5.1)
ALT: 28 U/L (ref 6–29)
AST: 22 U/L (ref 10–35)
Alkaline phosphatase (APISO): 69 U/L (ref 33–130)
BUN: 13 mg/dL (ref 7–25)
CHLORIDE: 102 mmol/L (ref 98–110)
CO2: 25 mmol/L (ref 20–32)
CREATININE: 0.89 mg/dL (ref 0.50–1.05)
Calcium: 9.8 mg/dL (ref 8.6–10.4)
GLUCOSE: 103 mg/dL — AB (ref 65–99)
Globulin: 2.7 g/dL (calc) (ref 1.9–3.7)
Potassium: 4.8 mmol/L (ref 3.5–5.3)
Sodium: 138 mmol/L (ref 135–146)
Total Bilirubin: 0.5 mg/dL (ref 0.2–1.2)
Total Protein: 7.5 g/dL (ref 6.1–8.1)

## 2018-05-09 LAB — PAP IG AND HPV HIGH-RISK: HPV DNA High Risk: NOT DETECTED

## 2018-05-09 LAB — CBC WITH DIFFERENTIAL/PLATELET
BASOS ABS: 72 {cells}/uL (ref 0–200)
BASOS PCT: 1.1 %
EOS ABS: 33 {cells}/uL (ref 15–500)
Eosinophils Relative: 0.5 %
HCT: 42.7 % (ref 35.0–45.0)
Hemoglobin: 14.3 g/dL (ref 11.7–15.5)
Lymphs Abs: 2178 cells/uL (ref 850–3900)
MCH: 29.4 pg (ref 27.0–33.0)
MCHC: 33.5 g/dL (ref 32.0–36.0)
MCV: 87.7 fL (ref 80.0–100.0)
MONOS PCT: 7.8 %
MPV: 11.7 fL (ref 7.5–12.5)
Neutro Abs: 3712 cells/uL (ref 1500–7800)
Neutrophils Relative %: 57.1 %
PLATELETS: 231 10*3/uL (ref 140–400)
RBC: 4.87 10*6/uL (ref 3.80–5.10)
RDW: 12.2 % (ref 11.0–15.0)
TOTAL LYMPHOCYTE: 33.5 %
WBC mixed population: 507 cells/uL (ref 200–950)
WBC: 6.5 10*3/uL (ref 3.8–10.8)

## 2018-05-09 LAB — LIPID PANEL
CHOL/HDL RATIO: 1.8 (calc) (ref <5.0)
CHOLESTEROL: 114 mg/dL (ref <200)
HDL: 62 mg/dL (ref >50)
LDL CHOLESTEROL (CALC): 41 mg/dL
NON-HDL CHOLESTEROL (CALC): 52 mg/dL (ref <130)
Triglycerides: 40 mg/dL (ref <150)

## 2018-05-09 LAB — TSH: TSH: 1.11 mIU/L

## 2018-06-04 ENCOUNTER — Other Ambulatory Visit (HOSPITAL_COMMUNITY): Payer: Self-pay | Admitting: *Deleted

## 2018-06-04 DIAGNOSIS — N6310 Unspecified lump in the right breast, unspecified quadrant: Secondary | ICD-10-CM

## 2018-07-24 ENCOUNTER — Ambulatory Visit
Admission: RE | Admit: 2018-07-24 | Discharge: 2018-07-24 | Disposition: A | Discharge status: Home / Self Care | Payer: BLUE CROSS/BLUE SHIELD | Source: Ambulatory Visit | Attending: Obstetrics and Gynecology | Admitting: Obstetrics and Gynecology

## 2018-07-24 ENCOUNTER — Ambulatory Visit (HOSPITAL_COMMUNITY)
Admission: RE | Admit: 2018-07-24 | Discharge: 2018-07-24 | Disposition: A | Discharge status: Home / Self Care | Payer: Self-pay | Source: Ambulatory Visit | Attending: Obstetrics and Gynecology | Admitting: Obstetrics and Gynecology

## 2018-07-24 ENCOUNTER — Encounter (HOSPITAL_COMMUNITY): Payer: Self-pay

## 2018-07-24 ENCOUNTER — Other Ambulatory Visit (HOSPITAL_COMMUNITY): Payer: Self-pay | Admitting: Obstetrics and Gynecology

## 2018-07-24 ENCOUNTER — Ambulatory Visit
Admission: RE | Admit: 2018-07-24 | Discharge: 2018-07-24 | Disposition: A | Discharge status: Home / Self Care | Payer: No Typology Code available for payment source | Source: Ambulatory Visit | Attending: Obstetrics and Gynecology | Admitting: Obstetrics and Gynecology

## 2018-07-24 VITALS — BP 114/72 | Wt 183.0 lb

## 2018-07-24 DIAGNOSIS — N6310 Unspecified lump in the right breast, unspecified quadrant: Secondary | ICD-10-CM

## 2018-07-24 DIAGNOSIS — Z1239 Encounter for other screening for malignant neoplasm of breast: Secondary | ICD-10-CM

## 2018-07-24 DIAGNOSIS — N6320 Unspecified lump in the left breast, unspecified quadrant: Secondary | ICD-10-CM

## 2018-07-24 NOTE — Progress Notes (Signed)
Complaints of a lump underneath her right breast since September 2019 that is painful. Patient states the pain comes and goes. Patient rates the pain at a 2-3 out of 10.  Pap Smear: Pap smear not completed today. Last Pap smear was 05/08/2018 at Dr. Zelphia Cairo office and normal with negative HPV. Per patient has a history of an abnormal Pap smear 30 years ago that a repeat Pap smear was completed for follow-up. Per patient all Pap smears have been normal since and she has had more than three normal pap smears since abnormal. Last Pap smear result is in Epic.  Physical exam: Breasts Breasts symmetrical. No skin abnormalities bilateral breasts. No nipple retraction bilateral breasts. No nipple discharge bilateral breasts. No lymphadenopathy. No lumps palpated bilateral breasts. Palpated a mass below the right breast that measured 13 cm x 8 cm. No complaints of pain or tenderness on exam. Referred patient to the [facility] for a diagnostic mammogram and right breast ultrasound. Appointment scheduled for Tuesday, July 24, 2018 at 1450.        Pelvic/Bimanual No Pap smear completed today since last Pap smear and HPV typing was 05/08/2018. Pap smear not indicated per BCCCP guidelines.   Smoking History: Patient has never smoked.  Patient Navigation: Patient education provided. Access to services provided for patient through [facility] program.   Colorectal Cancer Screening: Patient had a colonoscopy completed 06/20/2017. No complaints today. FIT Test given to patient to complete and return to BCCCP.  Breast and Cervical Cancer Risk Assessment: Patient has no family history of breast cancer, known genetic mutations, or radiation treatment to the chest before age 89. Patient has no history of cervical dysplasia, immunocompromised, or DES exposure in-utero.  Risk Assessment    Risk Scores      07/24/2018   Last edited by: Armond Hang, LPN   5-year risk: 1.1 %   Lifetime  risk: 9.9 %

## 2018-07-24 NOTE — Patient Instructions (Signed)
Explained breast self awareness with [name]. Patient did not need a Pap smear today due to last Pap smear and HPV typing was 05/08/2018. Let her know BCCCP will cover Pap smears and HPV typing every 5 years unless has a history of abnormal Pap smears. Referred patient to the [facility] for a diagnostic mammogram and right breast ultrasound. Appointment scheduled for Tuesday, July 24, 2018 at 1450. Patient aware of appointment and will be there. Patricia Owen verbalized understanding.  Breckan Cafiero, Arvil Chaco, RN 2:49 PM

## 2018-07-30 ENCOUNTER — Encounter (HOSPITAL_COMMUNITY): Payer: Self-pay | Admitting: *Deleted

## 2018-08-13 ENCOUNTER — Other Ambulatory Visit: Payer: Self-pay

## 2018-08-17 LAB — FECAL OCCULT BLOOD, IMMUNOCHEMICAL: Fecal Occult Bld: POSITIVE — AB

## 2018-08-30 ENCOUNTER — Encounter: Payer: Self-pay | Admitting: Gynecology

## 2018-08-30 NOTE — Telephone Encounter (Signed)
Okay.  She needs to see a gastroenterologist.

## 2018-08-30 NOTE — Telephone Encounter (Signed)
Where did she get the fecal occult test from?  Regardless, she needs to follow-up with a gastroenterologist.  Lots of reasons for a positive test to include hemorrhoids or more concerning such as cancer.  At her last appointment she reported her last colonoscopy 2018 which would make cancer unlikely but not impossible.  I would recommend following up with the gastroenterologist who did her colonoscopy 2018.

## 2018-08-30 NOTE — Telephone Encounter (Signed)
She went to [facility] at [facility].  HCS testing was done as part of that exam.

## 2018-10-05 ENCOUNTER — Telehealth: Payer: Self-pay

## 2018-10-05 NOTE — Telephone Encounter (Signed)
Either with myself or her primary provider

## 2018-10-05 NOTE — Telephone Encounter (Signed)
Left message in voice mail recommending OV with TF or PCP.  To call the office to schedule if wants.

## 2018-10-05 NOTE — Telephone Encounter (Signed)
Patient called complaining of being "irritable and snappy" for several months. She said her family has noticed and told her she needs to do something. She asked is there anything you can prescribe to help with these emotional issues. SHe asked if she should schedule a visit.

## 2018-10-16 ENCOUNTER — Encounter: Payer: Self-pay | Admitting: Gynecology

## 2018-10-16 ENCOUNTER — Ambulatory Visit: Payer: BLUE CROSS/BLUE SHIELD | Admitting: Gynecology

## 2018-10-16 VITALS — BP 118/76

## 2018-10-16 DIAGNOSIS — N951 Menopausal and female climacteric states: Secondary | ICD-10-CM | POA: Diagnosis not present

## 2018-10-16 MED ORDER — ESTRADIOL 0.5 MG PO TABS: 0.5000 mg | ORAL_TABLET | Freq: Every day | ORAL | 11 refills | Status: AC

## 2018-10-16 NOTE — Patient Instructions (Signed)
Start on the estrogen replacement as we discussed.  Call if you have any issues after starting the estrogen.  Call if you have any bleeding.

## 2018-10-16 NOTE — Progress Notes (Signed)
    Patricia Owen [birth year] [id number]        53 y.o.  G1P0010 presents complaining of worsening hot flashes, sweats and short tempered.  We had talked about this at her annual exam and she has tried over-the-counter products but these do not seem to be helping.  She is also in the process of being evaluated for a chronic cough, reflux and abdominal bloating by gastroenterology.  Has Mirena IUD with no bleeding.  TSH this past fall was normal.  Past medical history,surgical history, problem list, medications, allergies, family history and social history were all reviewed and documented in the EPIC chart.  Directed ROS with pertinent positives and negatives documented in the history of present illness/assessment and plan.  Exam:  General appearance:  Normal   Assessment/Plan:  53 y.o. G1P0010 with symptoms of the perimenopause to include night sweats, hot flushes and mood swings.  Has Mirena IUD with no bleeding.  We discussed options for menopausal symptoms to include observation, OTC products, pharmacologic nonhormonal and HRT.  Pros and cons of each choice reviewed.  Risks of HRT discussed to include various studies.  Increased risk of thrombosis such as stroke heart attack DVT in the breast cancer issue was reviewed.  Benefits to include symptom relief as well as possible cardiovascular and bone health when started earlier reviewed.  Various formulations to include oral transdermal and transvaginal discussed.  The progesterone component was also reviewed.  She has a Mirena IUD in the off brand labeling benefit for endometrial suppression discussed.  After lengthy discussion the patient wants a trial of HRT and would prefer oral understanding slight increased risk of thrombosis over transdermal routes.  Will use the Mirena IUD for endometrial protection.  We will start with estradiol 0.5 mg daily.  She will call in several weeks if she does not feel she is getting an adequate relief of her symptoms.   Will call if she has any bleeding.  Otherwise assuming she does well then she will follow-up this coming fall when due for her annual exam.    Anastasio Auerbach MD, 12:50 PM 10/16/2018

## 2019-03-21 ENCOUNTER — Encounter: Payer: Self-pay | Admitting: Gynecology

## 2019-03-22 NOTE — Telephone Encounter (Signed)
Unfortunately I do not know of anybody in that system to refer to.

## 2019-04-04 ENCOUNTER — Encounter: Payer: Self-pay | Admitting: Family Medicine

## 2019-05-10 ENCOUNTER — Encounter: Payer: Self-pay | Admitting: Gynecology

## 2019-05-22 ENCOUNTER — Encounter: Payer: Self-pay | Admitting: Gynecology

## 2019-06-04 ENCOUNTER — Ambulatory Visit: Payer: No Typology Code available for payment source | Admitting: Family Medicine

## 2019-06-04 ENCOUNTER — Other Ambulatory Visit: Payer: Self-pay

## 2019-12-30 ENCOUNTER — Encounter: Payer: Self-pay | Admitting: Family Medicine

## 2020-01-10 ENCOUNTER — Encounter: Payer: Self-pay | Admitting: *Deleted

## 2020-11-17 IMAGING — MG DIGITAL DIAGNOSTIC BILATERAL MAMMOGRAM WITH TOMO AND CAD
6 of 12 series · 6 of 36 positions shown · non-contrast
Comparison: Previous exam(s).

CLINICAL DATA: The patient presents with a palpable lump in the
upper right abdomen, inferior to the breast.

EXAM:
DIGITAL DIAGNOSTIC BILATERAL MAMMOGRAM WITH CAD AND TOMO
ULTRASOUND BILATERAL BREAST

[R MLO synth-2D]
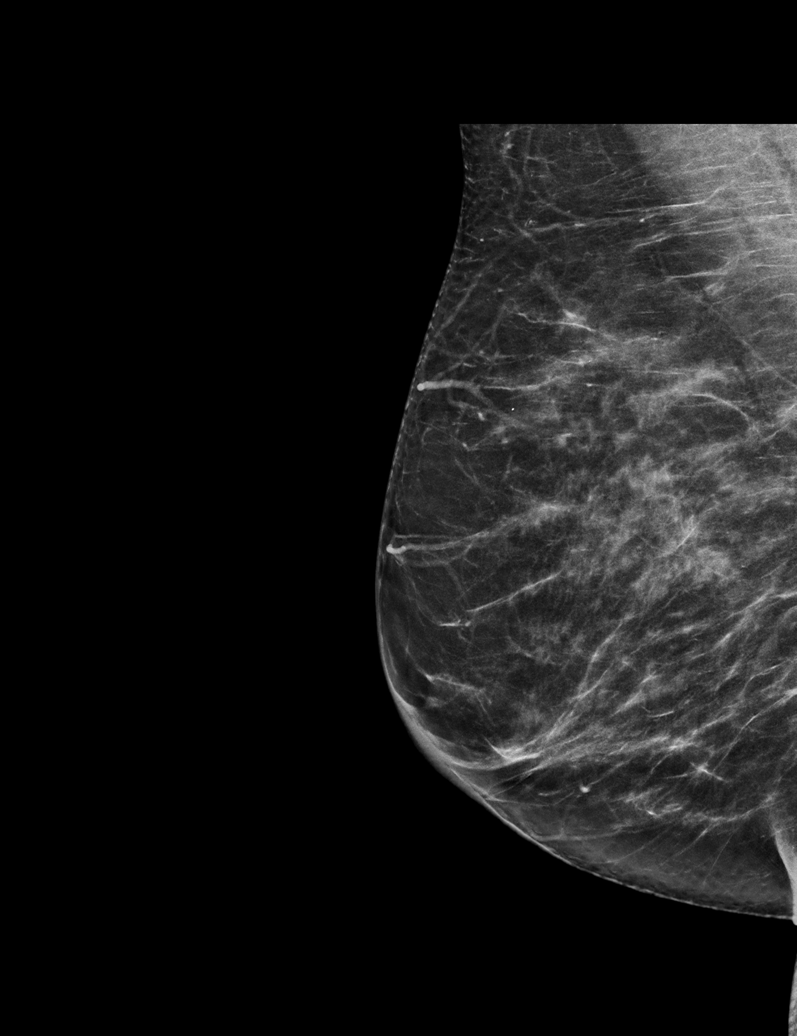

[L MLO synth-2D (1 of 2)]
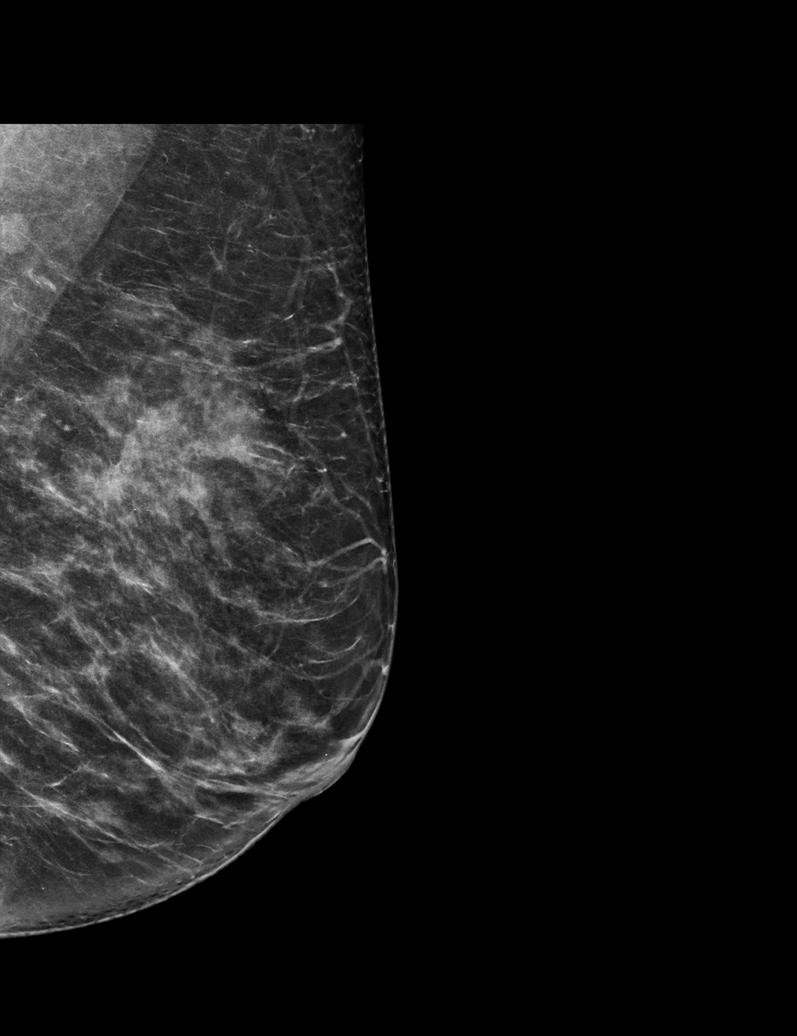

[L CC synth-2D (1 of 2)]
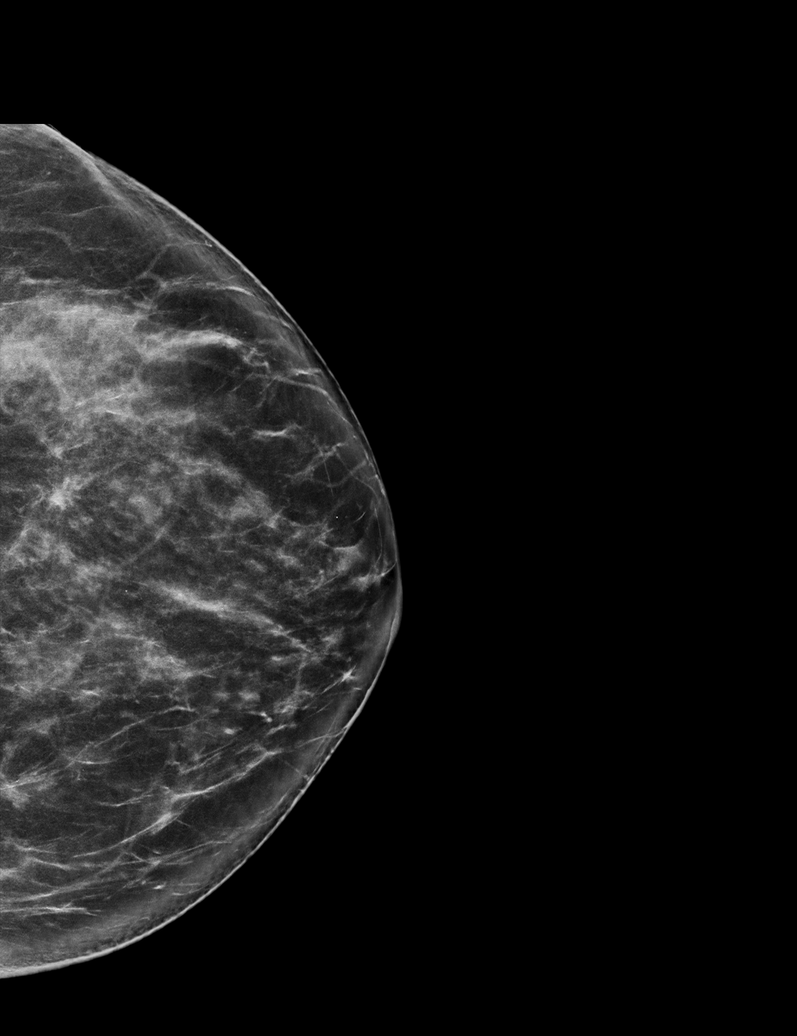

[R CC synth-2D]
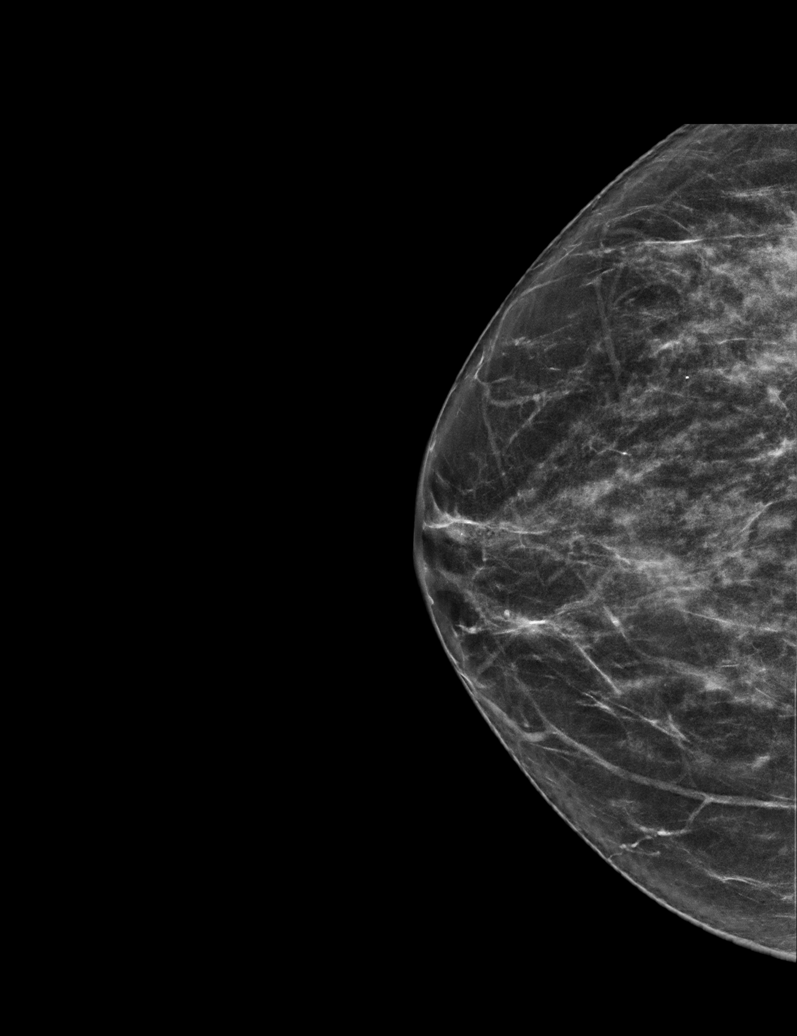

[L CC synth-2D (2 of 2)]
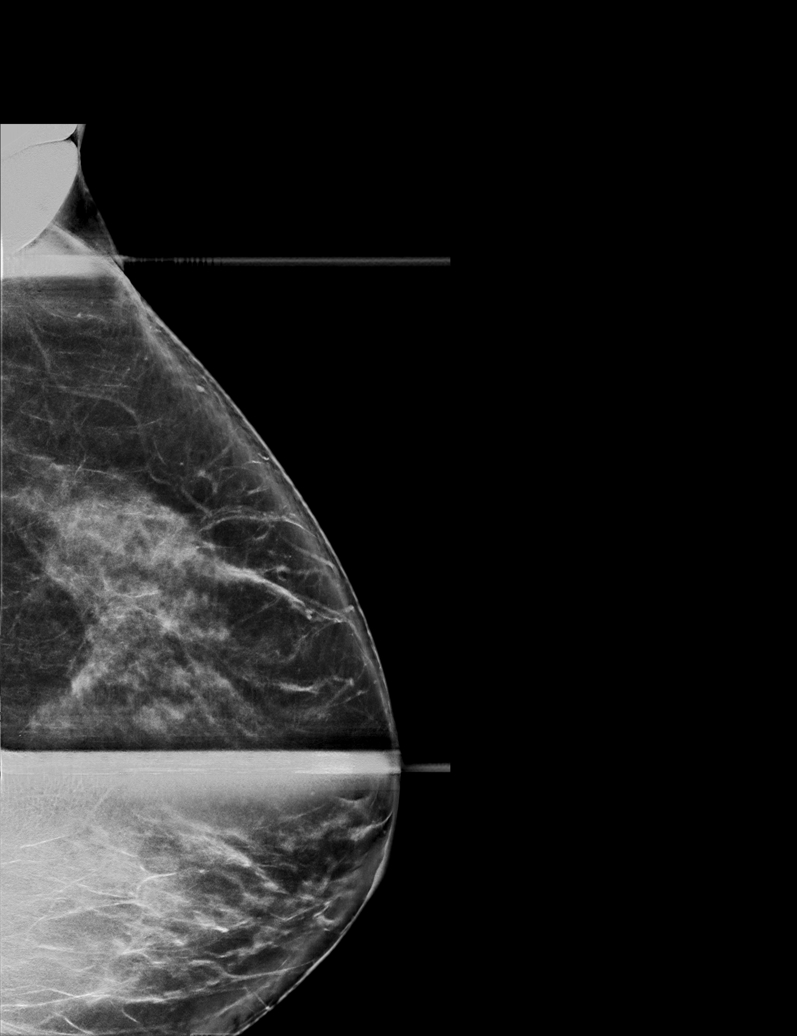

[L MLO synth-2D (2 of 2)]
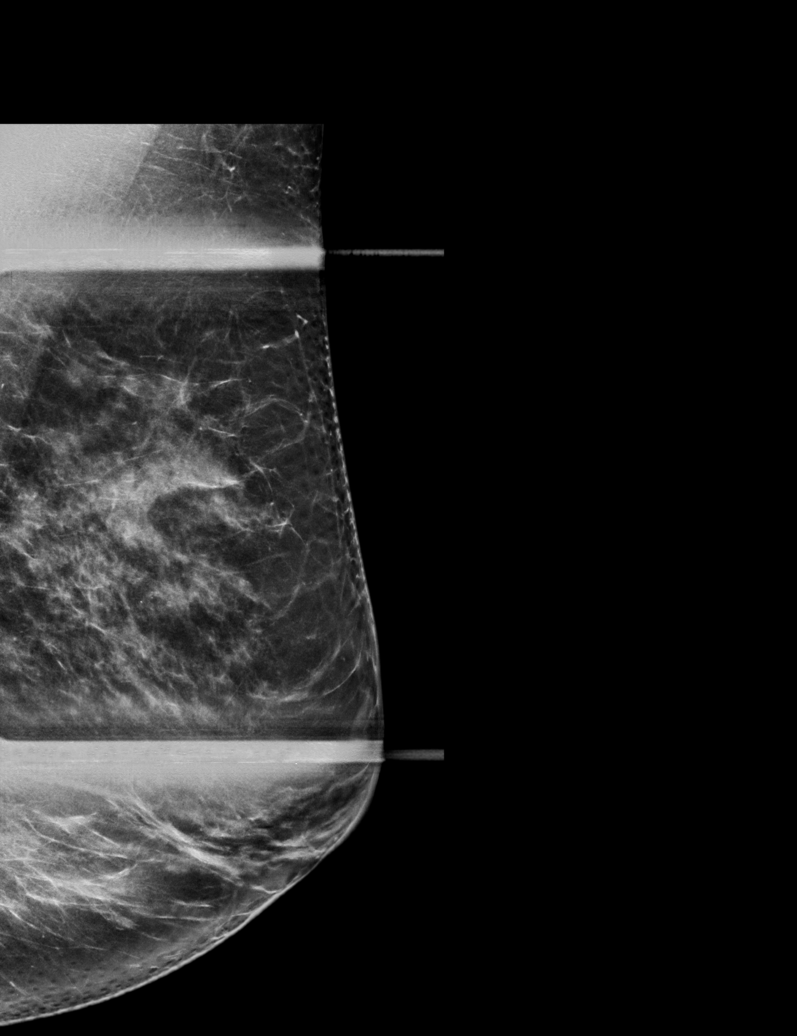

[6 of 36 positions shown; findings below may reference images not displayed]

ACR Breast Density Category c: The breast tissue is heterogeneously
dense, which may obscure small masses.
FINDINGS: There appears to be an obscured mass in the upper outer left breast.
No other mammographic abnormalities.

Mammographic images were processed with CAD.

On physical exam, no suspicious lumps are identified.

Targeted ultrasound is performed, showing a costochondral cartilage
at the site of the patient's upper right abdominal lump. By
palpation, this is not particularly asymmetric to the left. A simple
cyst is also seen in the left breast at 3 o'clock correlating with
the mammographically identified mass. No suspicious findings
otherwise seen in the upper outer quadrant of the left breast.
IMPRESSION: Fibrocystic changes on the left. Prominent costal cartilage being
felt in the upper right abdomen. No other suspicious mammographic
findings.

RECOMMENDATION:
Annual screening mammography.

I have discussed the findings and recommendations with the patient.
Results were also provided in writing at the conclusion of the
visit. If applicable, a reminder letter will be sent to the patient
regarding the next appointment.

BI-RADS CATEGORY  2: Benign.

## 2020-11-17 IMAGING — US ULTRASOUND LEFT BREAST LIMITED
1 series · 6 of 6 positions shown · non-contrast
Comparison: Previous exam(s).

CLINICAL DATA: The patient presents with a palpable lump in the
upper right abdomen, inferior to the breast.

EXAM:
DIGITAL DIAGNOSTIC BILATERAL MAMMOGRAM WITH CAD AND TOMO
ULTRASOUND BILATERAL BREAST

[Series 1: ultrasound left breast limited · 0.07mm/px · 6 of 6 slices shown]
[im 1/6]
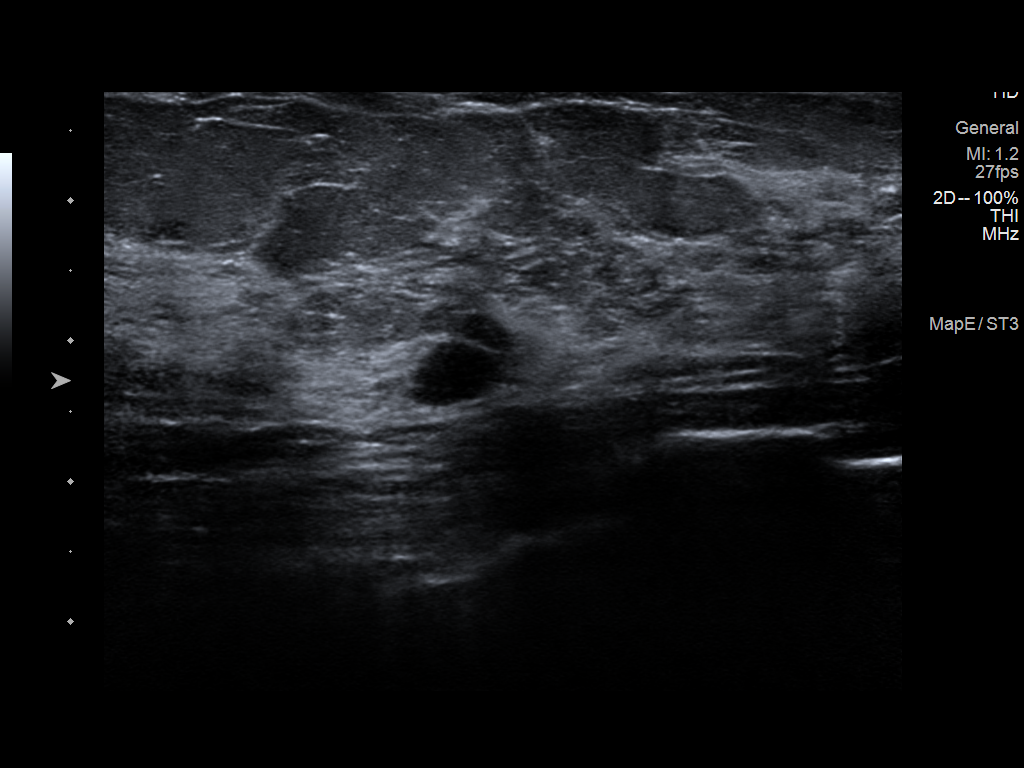
[im 2/6]
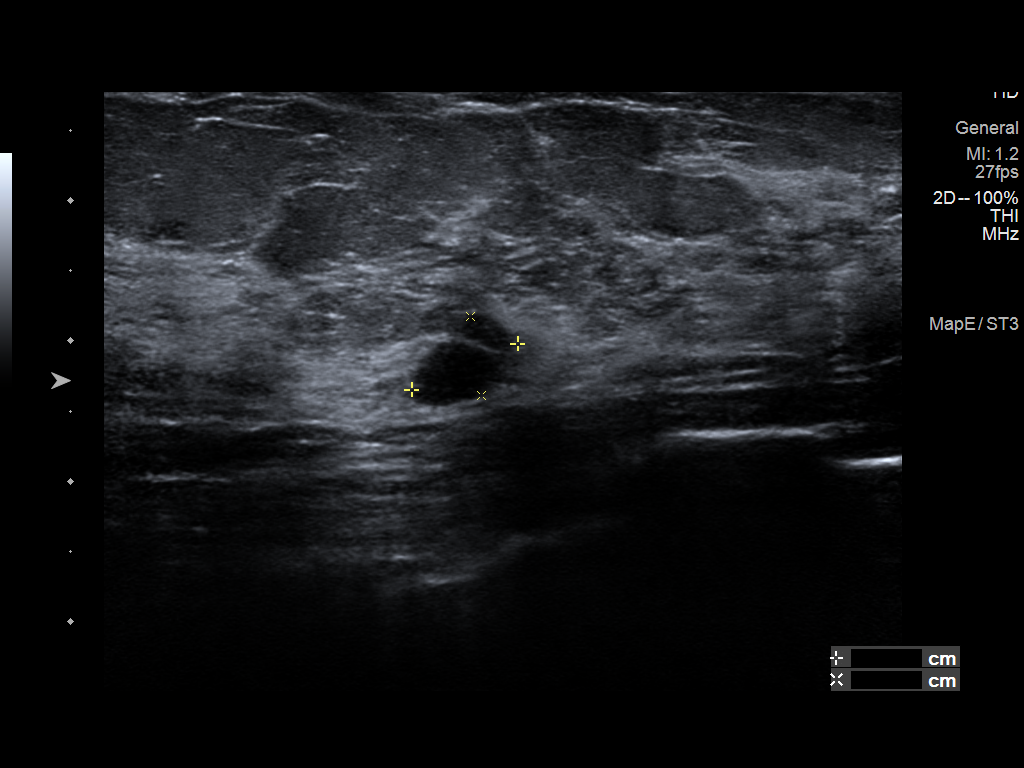
[im 3/6]
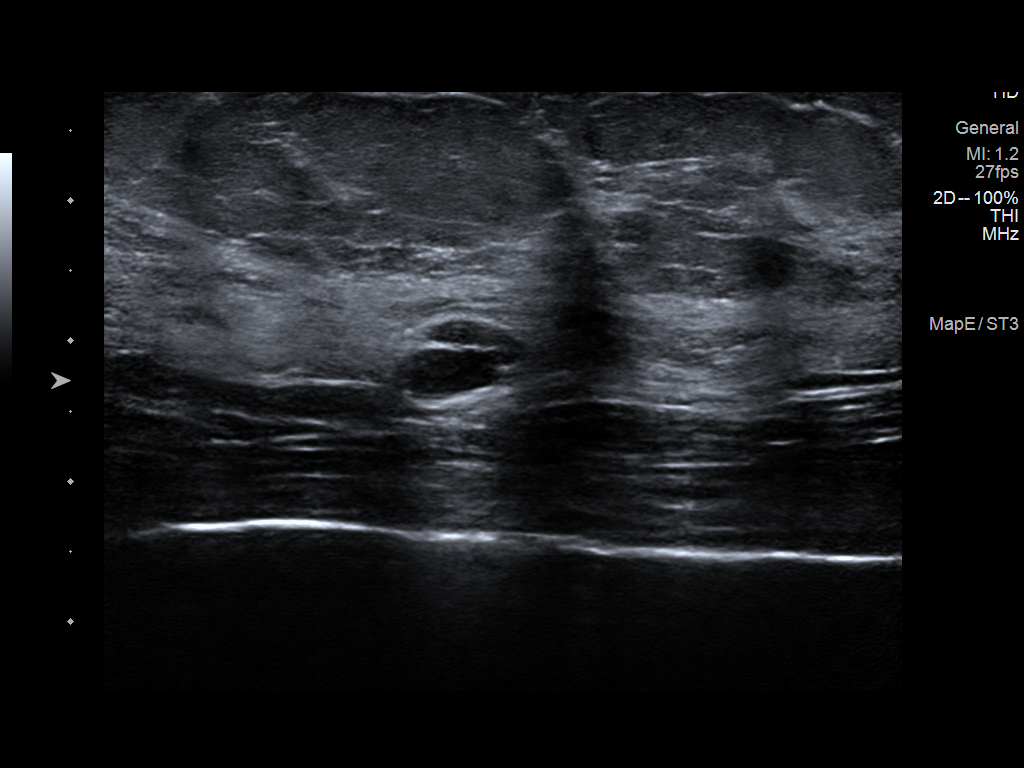
[im 4/6]
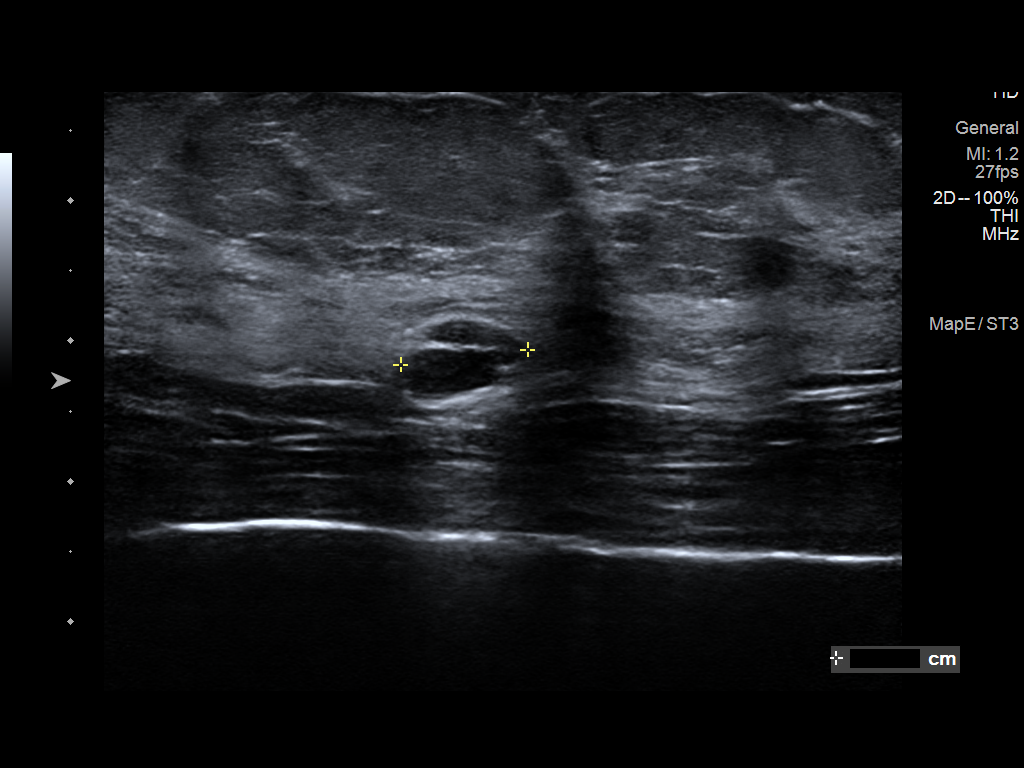
[im 5/6]
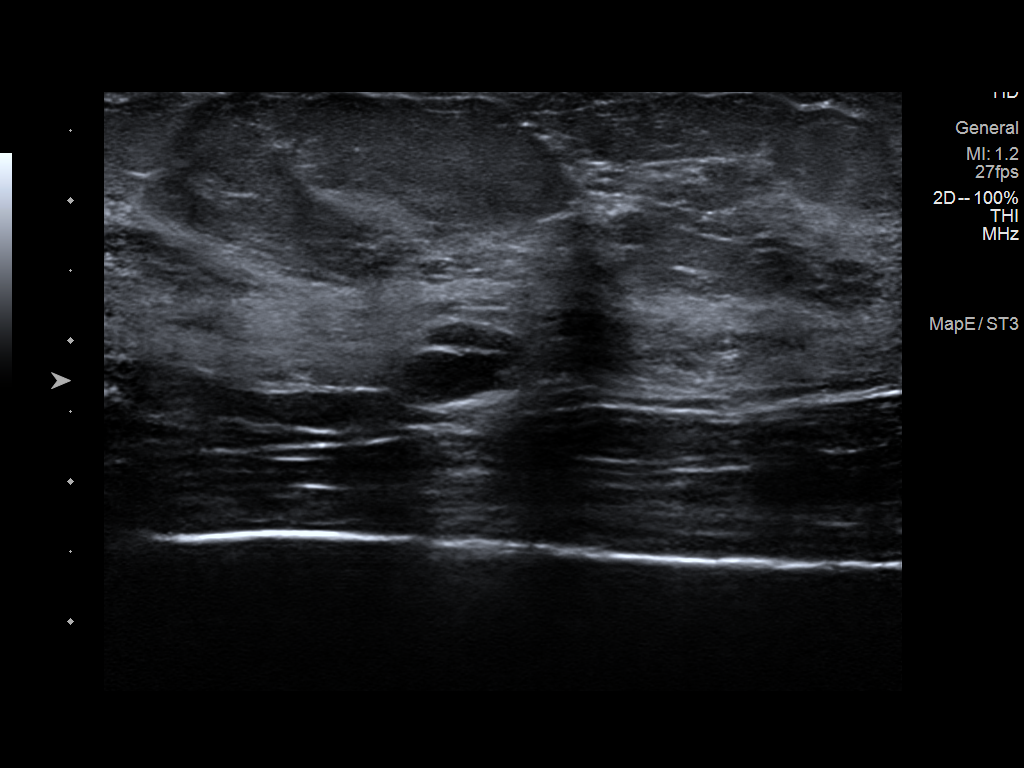
[im 6/6]
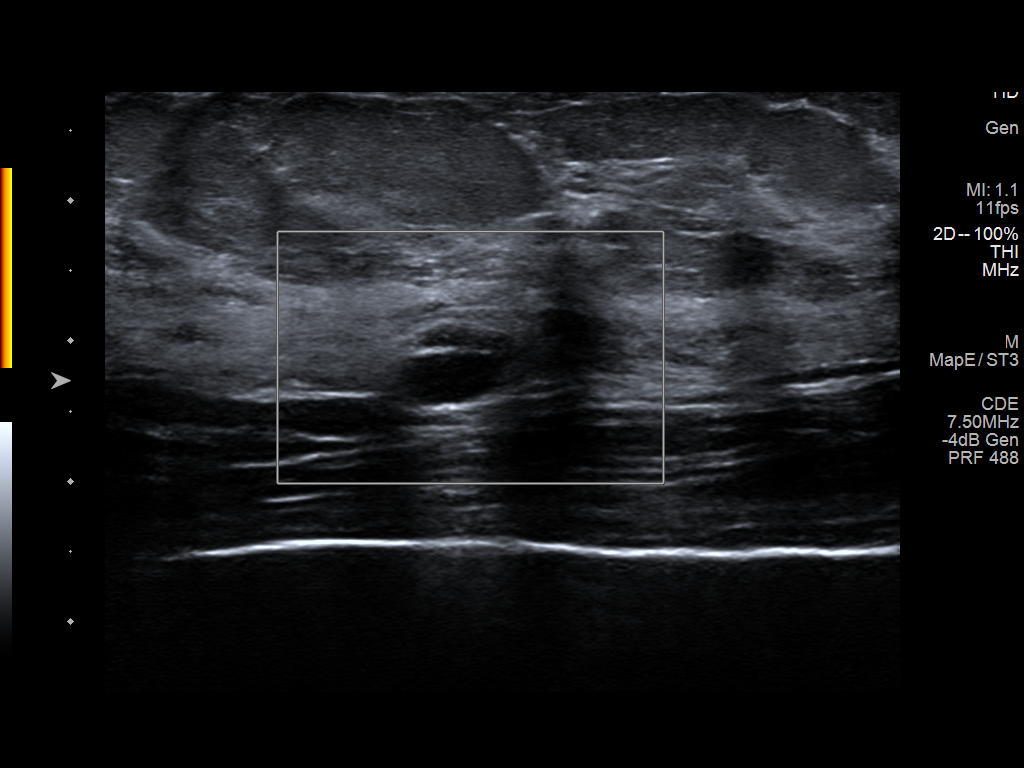

[6 of 6 positions shown; findings below may reference images not displayed]

ACR Breast Density Category c: The breast tissue is heterogeneously
dense, which may obscure small masses.
FINDINGS: There appears to be an obscured mass in the upper outer left breast.
No other mammographic abnormalities.

Mammographic images were processed with CAD.

On physical exam, no suspicious lumps are identified.

Targeted ultrasound is performed, showing a costochondral cartilage
at the site of the patient's upper right abdominal lump. By
palpation, this is not particularly asymmetric to the left. A simple
cyst is also seen in the left breast at 3 o'clock correlating with
the mammographically identified mass. No suspicious findings
otherwise seen in the upper outer quadrant of the left breast.
IMPRESSION: Fibrocystic changes on the left. Prominent costal cartilage being
felt in the upper right abdomen. No other suspicious mammographic
findings.

RECOMMENDATION:
Annual screening mammography.

I have discussed the findings and recommendations with the patient.
Results were also provided in writing at the conclusion of the
visit. If applicable, a reminder letter will be sent to the patient
regarding the next appointment.

BI-RADS CATEGORY  2: Benign.

## 2021-03-20 ENCOUNTER — Encounter: Payer: Self-pay | Admitting: Family Medicine

## 2021-03-22 ENCOUNTER — Telehealth: Payer: Self-pay

## 2021-03-22 MED ORDER — SCOPOLAMINE 1 MG/3DAYS TD PT72: 1.0000 | MEDICATED_PATCH | TRANSDERMAL | 1 refills | Status: AC

## 2021-03-22 NOTE — Telephone Encounter (Signed)
Patient called going diving and would like to see if they can get prescription for scopolamine patches.

## 2021-03-22 NOTE — Telephone Encounter (Signed)
ERx 

## 2021-03-22 NOTE — Telephone Encounter (Signed)
Last OV:  11/02/17, GI discomfort Next OV:  none

## 2021-09-25 ENCOUNTER — Encounter: Payer: Self-pay | Admitting: Family Medicine

## 2021-09-25 DIAGNOSIS — C50919 Malignant neoplasm of unspecified site of unspecified female breast: Secondary | ICD-10-CM | POA: Insufficient documentation

## 2021-09-25 HISTORY — DX: Malignant neoplasm of unspecified site of unspecified female breast: C50.919

## 2024-08-09 DIAGNOSIS — Z419 Encounter for procedure for purposes other than remedying health state, unspecified: Secondary | ICD-10-CM | POA: Diagnosis not present
